# Patient Record
Sex: Male | Born: 1980 | Race: Black or African American | Hispanic: No | Marital: Single | Smoking: Former smoker
Health system: Southern US, Community
[De-identification: ages and names within clinical notes are randomized; demographics above are authoritative.]

## PROBLEM LIST (undated history)

## (undated) DIAGNOSIS — M545 Low back pain, unspecified: Secondary | ICD-10-CM

## (undated) HISTORY — DX: Low back pain: M54.5

## (undated) HISTORY — DX: Low back pain, unspecified: M54.50

---

## 2003-09-01 ENCOUNTER — Encounter: Admission: RE | Admit: 2003-09-01 | Discharge: 2003-09-01 | Payer: Self-pay | Admitting: Internal Medicine

## 2004-04-06 ENCOUNTER — Emergency Department (HOSPITAL_COMMUNITY): Admission: EM | Admit: 2004-04-06 | Discharge: 2004-04-06 | Payer: Self-pay | Admitting: Family Medicine

## 2004-04-10 ENCOUNTER — Ambulatory Visit: Payer: Self-pay | Admitting: Family Medicine

## 2004-04-16 ENCOUNTER — Ambulatory Visit: Payer: Self-pay | Admitting: Family Medicine

## 2013-11-30 ENCOUNTER — Encounter: Payer: Self-pay | Admitting: Podiatry

## 2013-11-30 ENCOUNTER — Ambulatory Visit (INDEPENDENT_AMBULATORY_CARE_PROVIDER_SITE_OTHER): Payer: Federal, State, Local not specified - PPO

## 2013-11-30 ENCOUNTER — Ambulatory Visit (INDEPENDENT_AMBULATORY_CARE_PROVIDER_SITE_OTHER): Payer: Federal, State, Local not specified - PPO | Admitting: Podiatry

## 2013-11-30 VITALS — BP 117/75 | HR 92 | Resp 16 | Ht 76.0 in | Wt 286.0 lb

## 2013-11-30 DIAGNOSIS — M722 Plantar fascial fibromatosis: Secondary | ICD-10-CM | POA: Insufficient documentation

## 2013-11-30 MED ORDER — MELOXICAM 7.5 MG PO TABS
7.5000 mg | ORAL_TABLET | Freq: Every day | ORAL | Status: DC
Start: 2013-11-30 — End: 2014-06-30

## 2013-11-30 NOTE — Patient Instructions (Signed)
Plantar Fasciitis (Heel Spur Syndrome) with Rehab The plantar fascia is a fibrous, ligament-like, soft-tissue structure that spans the bottom of the foot. Plantar fasciitis is a condition that causes pain in the foot due to inflammation of the tissue. SYMPTOMS   Pain and tenderness on the underneath side of the foot.  Pain that worsens with standing or walking. CAUSES  Plantar fasciitis is caused by irritation and injury to the plantar fascia on the underneath side of the foot. Common mechanisms of injury include:  Direct trauma to bottom of the foot.  Damage to a small nerve that runs under the foot where the main fascia attaches to the heel bone.  Stress placed on the plantar fascia due to bone spurs. RISK INCREASES WITH:   Activities that place stress on the plantar fascia (running, jumping, pivoting, or cutting).  Poor strength and flexibility.  Improperly fitted shoes.  Tight calf muscles.  Flat feet.  Failure to warm-up properly before activity.  Obesity. PREVENTION  Warm up and stretch properly before activity.  Allow for adequate recovery between workouts.  Maintain physical fitness:  Strength, flexibility, and endurance.  Cardiovascular fitness.  Maintain a health body weight.  Avoid stress on the plantar fascia.  Wear properly fitted shoes, including arch supports for individuals who have flat feet. PROGNOSIS  If treated properly, then the symptoms of plantar fasciitis usually resolve without surgery. However, occasionally surgery is necessary. RELATED COMPLICATIONS   Recurrent symptoms that may result in a chronic condition.  Problems of the lower back that are caused by compensating for the injury, such as limping.  Pain or weakness of the foot during push-off following surgery.  Chronic inflammation, scarring, and partial or complete fascia tear, occurring more often from repeated injections. TREATMENT  Treatment initially involves the use of  ice and medication to help reduce pain and inflammation. The use of strengthening and stretching exercises may help reduce pain with activity, especially stretches of the Achilles tendon. These exercises may be performed at home or with a therapist. Your caregiver may recommend that you use heel cups of arch supports to help reduce stress on the plantar fascia. Occasionally, corticosteroid injections are given to reduce inflammation. If symptoms persist for greater than 6 months despite non-surgical (conservative), then surgery may be recommended.  MEDICATION   If pain medication is necessary, then nonsteroidal anti-inflammatory medications, such as aspirin and ibuprofen, or other minor pain relievers, such as acetaminophen, are often recommended.  Do not take pain medication within 7 days before surgery.  Prescription pain relievers may be given if deemed necessary by your caregiver. Use only as directed and only as much as you need.  Corticosteroid injections may be given by your caregiver. These injections should be reserved for the most serious cases, because they may only be given a certain number of times. HEAT AND COLD  Cold treatment (icing) relieves pain and reduces inflammation. Cold treatment should be applied for 10 to 15 minutes every 2 to 3 hours for inflammation and pain and immediately after any activity that aggravates your symptoms. Use ice packs or massage the area with a piece of ice (ice massage).  Heat treatment may be used prior to performing the stretching and strengthening activities prescribed by your caregiver, physical therapist, or athletic trainer. Use a heat pack or soak the injury in warm water. SEEK IMMEDIATE MEDICAL CARE IF:  Treatment seems to offer no benefit, or the condition worsens.  Any medications produce adverse side effects. EXERCISES RANGE   OF MOTION (ROM) AND STRETCHING EXERCISES - Plantar Fasciitis (Heel Spur Syndrome) These exercises may help you  when beginning to rehabilitate your injury. Your symptoms may resolve with or without further involvement from your physician, physical therapist or athletic trainer. While completing these exercises, remember:   Restoring tissue flexibility helps normal motion to return to the joints. This allows healthier, less painful movement and activity.  An effective stretch should be held for at least 30 seconds.  A stretch should never be painful. You should only feel a gentle lengthening or release in the stretched tissue. RANGE OF MOTION - Toe Extension, Flexion  Sit with your right / left leg crossed over your opposite knee.  Grasp your toes and gently pull them back toward the top of your foot. You should feel a stretch on the bottom of your toes and/or foot.  Hold this stretch for __________ seconds.  Now, gently pull your toes toward the bottom of your foot. You should feel a stretch on the top of your toes and or foot.  Hold this stretch for __________ seconds. Repeat __________ times. Complete this stretch __________ times per day.  RANGE OF MOTION - Ankle Dorsiflexion, Active Assisted  Remove shoes and sit on a chair that is preferably not on a carpeted surface.  Place right / left foot under knee. Extend your opposite leg for support.  Keeping your heel down, slide your right / left foot back toward the chair until you feel a stretch at your ankle or calf. If you do not feel a stretch, slide your bottom forward to the edge of the chair, while still keeping your heel down.  Hold this stretch for __________ seconds. Repeat __________ times. Complete this stretch __________ times per day.  STRETCH - Gastroc, Standing  Place hands on wall.  Extend right / left leg, keeping the front knee somewhat bent.  Slightly point your toes inward on your back foot.  Keeping your right / left heel on the floor and your knee straight, shift your weight toward the wall, not allowing your back to  arch.  You should feel a gentle stretch in the right / left calf. Hold this position for __________ seconds. Repeat __________ times. Complete this stretch __________ times per day. STRETCH - Soleus, Standing  Place hands on wall.  Extend right / left leg, keeping the other knee somewhat bent.  Slightly point your toes inward on your back foot.  Keep your right / left heel on the floor, bend your back knee, and slightly shift your weight over the back leg so that you feel a gentle stretch deep in your back calf.  Hold this position for __________ seconds. Repeat __________ times. Complete this stretch __________ times per day. STRETCH - Gastrocsoleus, Standing  Note: This exercise can place a lot of stress on your foot and ankle. Please complete this exercise only if specifically instructed by your caregiver.   Place the ball of your right / left foot on a step, keeping your other foot firmly on the same step.  Hold on to the wall or a rail for balance.  Slowly lift your other foot, allowing your body weight to press your heel down over the edge of the step.  You should feel a stretch in your right / left calf.  Hold this position for __________ seconds.  Repeat this exercise with a slight bend in your right / left knee. Repeat __________ times. Complete this stretch __________ times per day.    STRENGTHENING EXERCISES - Plantar Fasciitis (Heel Spur Syndrome)  These exercises may help you when beginning to rehabilitate your injury. They may resolve your symptoms with or without further involvement from your physician, physical therapist or athletic trainer. While completing these exercises, remember:   Muscles can gain both the endurance and the strength needed for everyday activities through controlled exercises.  Complete these exercises as instructed by your physician, physical therapist or athletic trainer. Progress the resistance and repetitions only as guided. STRENGTH -  Towel Curls  Sit in a chair positioned on a non-carpeted surface.  Place your foot on a towel, keeping your heel on the floor.  Pull the towel toward your heel by only curling your toes. Keep your heel on the floor.  If instructed by your physician, physical therapist or athletic trainer, add ____________________ at the end of the towel. Repeat __________ times. Complete this exercise __________ times per day. STRENGTH - Ankle Inversion  Secure one end of a rubber exercise band/tubing to a fixed object (table, pole). Loop the other end around your foot just before your toes.  Place your fists between your knees. This will focus your strengthening at your ankle.  Slowly, pull your big toe up and in, making sure the band/tubing is positioned to resist the entire motion.  Hold this position for __________ seconds.  Have your muscles resist the band/tubing as it slowly pulls your foot back to the starting position. Repeat __________ times. Complete this exercises __________ times per day.  Document Released: 12/31/2004 Document Revised: 03/25/2011 Document Reviewed: 04/14/2008 [employer] Patient Information 2015 [employer]. This information is not intended to replace advice given to you by your health care provider. Make sure you discuss any questions you have with your health care provider.  

## 2013-11-30 NOTE — Progress Notes (Signed)
   Subjective:    Patient ID: Eric Faulkner, male    DOB: 1981-01-14, 33 y.o.   MRN: [id number]  HPI Comments: 33 year old male presents the office today with complaints of pain to both of his heels on the bottom aspect of his been ongoing for approximately 2 months. He believes he has plantar fasciitis. He states the pain has been progressive. He states that he has pain particularly in the morning or after periods of rest. After getting up after rest with ambulation he has improvement in symptoms. He states that he had similar pain last year as she certainly when were closed toed shoes and the pain resolved. He states the pain is started [facility] cooler and he has been wearing were closed toe shoes. He has been massaging his feet from a place that the shoe market. He denies any recent injury or trauma to the area. No other complaints at this time.   Foot Pain      Review of Systems  Musculoskeletal: Positive for back pain.  All other systems reviewed and are negative.      Objective:   Physical Exam AAO x3, NAD DP/PT pulses palpable bilaterally, CRT less than 3 seconds Protective sensation intact with Simms Weinstein monofilament, vibratory sensation intact, Achilles tendon reflex intact Dennis palpation of the plantar medial tubercle of the calcaneus at the insertion of the plantar fascia bilaterally. There is no pain within the arch of the foot and the plantar fascia. To be intact. There is no pain with lateral compression of the calcaneus or pain with vibratory sensation. No pain along the posterior aspect of the calcaneus or along the course of the insertion of the Achilles tendon. There is no overlying edema, erythema, increase in warmth. MMT 5/5, ROM WNL Calf pain with compression, swelling, warmth, erythema No open lesions or pre-ulcerative lesions.       Assessment & Plan:  33 year old male with bilateral heel pain, likely plantar fasciitis. -X-rays  were obtained and reviewed the patient. -Conservative versus surgical treatment discussed including alternatives, risks, complications. -Patient to hold off on any steroid injection at this time. -Discussed stretching exercises. -Ice to the affected area. -Prescribed mobic -Discussed shoe gear modifications and possible inserts. -Follow-up in 3 weeks or sooner any problems are to arise. In the meantime, call the office with any questions, concerns, change in symptoms.

## 2013-12-03 ENCOUNTER — Ambulatory Visit: Payer: Self-pay | Admitting: Podiatry

## 2013-12-21 ENCOUNTER — Ambulatory Visit: Payer: Federal, State, Local not specified - PPO | Admitting: Podiatry

## 2014-06-30 ENCOUNTER — Encounter: Payer: Self-pay | Admitting: Family Medicine

## 2014-06-30 ENCOUNTER — Encounter (INDEPENDENT_AMBULATORY_CARE_PROVIDER_SITE_OTHER): Payer: Self-pay

## 2014-06-30 ENCOUNTER — Ambulatory Visit (INDEPENDENT_AMBULATORY_CARE_PROVIDER_SITE_OTHER): Payer: Federal, State, Local not specified - PPO | Admitting: Family Medicine

## 2014-06-30 VITALS — BP 108/70 | HR 90 | Temp 97.5°F | Ht 76.0 in | Wt 300.0 lb

## 2014-06-30 DIAGNOSIS — Z0000 Encounter for general adult medical examination without abnormal findings: Secondary | ICD-10-CM | POA: Diagnosis not present

## 2014-06-30 DIAGNOSIS — J302 Other seasonal allergic rhinitis: Secondary | ICD-10-CM | POA: Diagnosis not present

## 2014-06-30 DIAGNOSIS — R202 Paresthesia of skin: Secondary | ICD-10-CM | POA: Insufficient documentation

## 2014-06-30 DIAGNOSIS — Z13220 Encounter for screening for lipoid disorders: Secondary | ICD-10-CM

## 2014-06-30 DIAGNOSIS — L739 Follicular disorder, unspecified: Secondary | ICD-10-CM

## 2014-06-30 NOTE — Progress Notes (Signed)
Pre visit review using our clinic review tool, if applicable. No additional management support is needed unless otherwise documented below in the visit note. 

## 2014-06-30 NOTE — Patient Instructions (Addendum)
Start regular exercise and work on healthy eating habits. Increase water in diet, decrease juice or soda. Decrease eating out. Return fasting in the next few weeks for labs. Look into Tdap.  Make appt to be seen if foot issues not resolving.

## 2014-06-30 NOTE — Assessment & Plan Note (Signed)
Eval with labs. 

## 2014-06-30 NOTE — Assessment & Plan Note (Signed)
Healing. Use topical antibiotic gel to treat in future.Start using antibacterial soap to prevent.

## 2014-06-30 NOTE — Progress Notes (Signed)
Subjective:    Patient ID: Eric Faulkner, male    DOB: 1980/04/06, 34 y.o.   MRN: [id number]  HPI   34 year old male presents to establish care. He has been seen by Dr. Daisy Lazar in past.  Last CPX was 2-3 years ago. Has not had chol and DM screen.  Seasonal allergies stable control with Claritin prn. Worse in spring cutting grass.  He is followed by Dr. Ardelle Anton for podiatry to treat his plantar fasciitis. Last OV 11/2013. Bothersome to him but better overall.  He has gained 20 lbs in last year.  Body mass index is 36.53 kg/(m^2).  No regular exercise other than mowing lawn. Diet: Eats out of a lot.  Some fast food.  Social History /Family History/Past Medical History reviewed and updated if needed.   Review of Systems  Constitutional: Negative for fever.  HENT: Negative for ear pain.   Eyes: Negative for pain.  Respiratory: Negative for cough and shortness of breath.   Gastrointestinal: Negative for abdominal pain, diarrhea, constipation and blood in stool.  Genitourinary: Negative for dysuria, hematuria, decreased urine volume and penile swelling.       Occ notes ? Ingrown hairs at pubic hair  no new exposure to STD  Musculoskeletal: Positive for back pain. Negative for myalgias, arthralgias and gait problem.  Skin: Positive for rash.       In last 1 week noted dried lesion on left side at waist band, notes off and on.  Neurological: Negative for dizziness and numbness.       Tingling on bottom of feet in last year. Heel pain with walking   Psychiatric/Behavioral: Negative for behavioral problems, self-injury, dysphoric mood and agitation. The patient is not nervous/anxious.        Objective:   Physical Exam  Constitutional: He appears well-developed and well-nourished.  Non-toxic appearance. He does not appear ill. No distress.  HENT:  Head: Normocephalic and atraumatic.  Right Ear: Hearing, tympanic membrane, external ear and ear canal normal.  Left Ear: Hearing,  tympanic membrane, external ear and ear canal normal.  Nose: Nose normal.  Mouth/Throat: Uvula is midline, oropharynx is clear and moist and mucous membranes are normal.  Eyes: Conjunctivae, EOM and lids are normal. Pupils are equal, round, and reactive to light. Lids are everted and swept, no foreign bodies found.  Neck: Trachea normal, normal range of motion and phonation normal. Neck supple. Carotid bruit is not present. No thyroid mass and no thyromegaly present.  Cardiovascular: Normal rate, regular rhythm, S1 normal, S2 normal, intact distal pulses and normal pulses.  Exam reveals no gallop.   No murmur heard. Pulmonary/Chest: Breath sounds normal. He has no wheezes. He has no rhonchi. He has no rales.  Abdominal: Soft. Normal appearance and bowel sounds are normal. There is no hepatosplenomegaly. There is no tenderness. There is no rebound, no guarding and no CVA tenderness. No hernia. Hernia confirmed negative in the right inguinal area and confirmed negative in the left inguinal area.  Genitourinary: Prostate normal, testes normal and penis normal. Rectal exam shows no external hemorrhoid, no internal hemorrhoid, no fissure, no mass, no tenderness and anal tone normal. Guaiac negative stool.    Prostate is not enlarged and not tender. Right testis shows no mass and no tenderness. Left testis shows no mass and no tenderness. No paraphimosis or penile tenderness.  Few small ingrown hairs, pustules in groin and in pubic hair, most healing, scabbing on left side at wiast band.  Lymphadenopathy:    He has no cervical adenopathy.       Right: No inguinal adenopathy present.       Left: No inguinal adenopathy present.  Neurological: He is alert. He has normal strength and normal reflexes. No cranial nerve deficit or sensory deficit. Gait normal.  Skin: Skin is warm, dry and intact. No rash noted.  Psychiatric: He has a normal mood and affect. His speech is normal and behavior is normal.  Judgment normal.          Assessment & Plan:  The patient's preventative maintenance and recommended screening tests for an annual wellness exam were reviewed in full today. Brought up to date unless services declined.  Counselled on the importance of diet, exercise, and its role in overall health and mortality. The patient's FH and SH was reviewed, including their home life, tobacco status, and drug and alcohol status.   Vaccines: Look into Tdap. No early prostate cancer. Refused STD testing.

## 2014-07-01 ENCOUNTER — Encounter: Payer: Self-pay | Admitting: *Deleted

## 2014-07-01 ENCOUNTER — Other Ambulatory Visit (INDEPENDENT_AMBULATORY_CARE_PROVIDER_SITE_OTHER): Payer: Federal, State, Local not specified - PPO

## 2014-07-01 DIAGNOSIS — R202 Paresthesia of skin: Secondary | ICD-10-CM | POA: Diagnosis not present

## 2014-07-01 DIAGNOSIS — Z13220 Encounter for screening for lipoid disorders: Secondary | ICD-10-CM | POA: Diagnosis not present

## 2014-07-01 LAB — COMPREHENSIVE METABOLIC PANEL
ALBUMIN: 4.1 g/dL (ref 3.5–5.2)
ALK PHOS: 68 U/L (ref 39–117)
ALT: 23 U/L (ref 0–53)
AST: 15 U/L (ref 0–37)
BILIRUBIN TOTAL: 0.5 mg/dL (ref 0.2–1.2)
BUN: 15 mg/dL (ref 6–23)
CO2: 27 mEq/L (ref 19–32)
Calcium: 9.5 mg/dL (ref 8.4–10.5)
Chloride: 105 mEq/L (ref 96–112)
Creatinine, Ser: 1.25 mg/dL (ref 0.40–1.50)
GFR: 84.25 mL/min (ref >60.00)
GLUCOSE: 99 mg/dL (ref 70–99)
POTASSIUM: 3.9 meq/L (ref 3.5–5.1)
SODIUM: 137 meq/L (ref 135–145)
TOTAL PROTEIN: 7.4 g/dL (ref 6.0–8.3)

## 2014-07-01 LAB — LIPID PANEL
CHOLESTEROL: 187 mg/dL (ref 0–200)
HDL: 39.1 mg/dL (ref >39.00)
LDL Cholesterol: 129 mg/dL — ABNORMAL HIGH (ref 0–99)
NONHDL: 147.9
Total CHOL/HDL Ratio: 5
Triglycerides: 94 mg/dL (ref 0.0–149.0)
VLDL: 18.8 mg/dL (ref 0.0–40.0)

## 2014-07-01 LAB — TSH: TSH: 1.14 u[IU]/mL (ref 0.35–4.50)

## 2014-07-01 LAB — VITAMIN B12: VITAMIN B 12: 280 pg/mL (ref 211–911)

## 2015-05-16 ENCOUNTER — Ambulatory Visit: Payer: Federal, State, Local not specified - PPO | Admitting: Family Medicine

## 2015-05-17 ENCOUNTER — Encounter: Payer: Self-pay | Admitting: Family Medicine

## 2015-05-17 ENCOUNTER — Ambulatory Visit (INDEPENDENT_AMBULATORY_CARE_PROVIDER_SITE_OTHER): Payer: Federal, State, Local not specified - PPO | Admitting: Family Medicine

## 2015-05-17 VITALS — BP 110/62 | HR 87 | Ht 76.0 in | Wt 287.8 lb

## 2015-05-17 DIAGNOSIS — F4329 Adjustment disorder with other symptoms: Secondary | ICD-10-CM

## 2015-05-17 NOTE — Assessment & Plan Note (Signed)
Pt with difficulty handling tasks at work due to feeling overwhelmed. He is not functioning at work in  Stress of job has spread to home life. Feelings of mild depression and anhedonia. NO SI, NO HI. Pt has noted some initial improvement with counseling (will continue) and church support. He has been reading self help literature and trying to regain balance in life. He requests 30 days to continue working on stress reduction and stress management techniques. FMLA forms completed for leave.  Pt with history of mental breakdown causing hospitalization/medication. It is in his best interest to manage stress before he gets to a " breakdown" point.

## 2015-05-17 NOTE — Progress Notes (Signed)
   Subjective:    Patient ID: Eric Faulkner, male    DOB: [birth year], 35 y.o.   MRN: [id number]  HPI  35 year old male previously in good health presents to clinic today for new onset stress at work in last several months.  He has been very frustrated with issues at work.  He has been overwhelmed.  Pt vaguely mentions discrimination issues at work that are contributing to stress.  Some anhedonia. More irritable, moody. He was sleeping more often. Family noticed him being distance, more tearful. Wife said he was not interacting. Has been isolating himself more. No mania symptoms.  No SI, no HI.  He has been praying more, going to revival. Things are improving. Has church counseling and has set up counselor. Has appt next week.  He has now had a change at work that will lessen stress at work.  He is not interested in medication to treat.  Last day at work 05/09/2014-06/08/2014.   Hx of breakdown in past. [facility] with medication. Pt is vague about his diagnosis. Social History /Family History/Past Medical History reviewed and updated if needed.  Review of Systems  Constitutional: Negative for fever and fatigue.  HENT: Negative for ear pain.   Eyes: Negative for pain.  Respiratory: Negative for shortness of breath.   Cardiovascular: Negative for chest pain.       Objective:   Physical Exam  Constitutional: Vital signs are normal. He appears well-developed and well-nourished.  HENT:  Head: Normocephalic.  Right Ear: Hearing normal.  Left Ear: Hearing normal.  Nose: Nose normal.  Mouth/Throat: Oropharynx is clear and moist and mucous membranes are normal.  Neck: Trachea normal. Carotid bruit is not present. No thyroid mass and no thyromegaly present.  Cardiovascular: Normal rate, regular rhythm and normal pulses.  Exam reveals no gallop, no distant heart sounds and no friction rub.   No murmur heard. No peripheral edema    Pulmonary/Chest: Effort normal and breath sounds normal. No respiratory distress.  Skin: Skin is warm, dry and intact. No rash noted.  Psychiatric: He has a normal mood and affect. His speech is normal. Judgment and thought content normal. He is not agitated. Cognition and memory are normal.          Assessment & Plan:

## 2015-07-04 ENCOUNTER — Telehealth: Payer: Self-pay | Admitting: Family Medicine

## 2015-07-04 ENCOUNTER — Other Ambulatory Visit (INDEPENDENT_AMBULATORY_CARE_PROVIDER_SITE_OTHER): Payer: Federal, State, Local not specified - PPO

## 2015-07-04 DIAGNOSIS — Z13220 Encounter for screening for lipoid disorders: Secondary | ICD-10-CM

## 2015-07-04 LAB — LIPID PANEL
CHOLESTEROL: 183 mg/dL (ref 0–200)
HDL: 42.2 mg/dL (ref >39.00)
LDL CALC: 122 mg/dL — AB (ref 0–99)
NonHDL: 140.37
TRIGLYCERIDES: 94 mg/dL (ref 0.0–149.0)
Total CHOL/HDL Ratio: 4
VLDL: 18.8 mg/dL (ref 0.0–40.0)

## 2015-07-04 LAB — COMPREHENSIVE METABOLIC PANEL
ALBUMIN: 4.4 g/dL (ref 3.5–5.2)
ALT: 19 U/L (ref 0–53)
AST: 17 U/L (ref 0–37)
Alkaline Phosphatase: 70 U/L (ref 39–117)
BILIRUBIN TOTAL: 0.5 mg/dL (ref 0.2–1.2)
BUN: 17 mg/dL (ref 6–23)
CALCIUM: 9.9 mg/dL (ref 8.4–10.5)
CHLORIDE: 105 meq/L (ref 96–112)
CO2: 29 mEq/L (ref 19–32)
CREATININE: 1.31 mg/dL (ref 0.40–1.50)
GFR: 79.36 mL/min (ref >60.00)
Glucose, Bld: 94 mg/dL (ref 70–99)
Potassium: 4 mEq/L (ref 3.5–5.1)
Sodium: 140 mEq/L (ref 135–145)
Total Protein: 8 g/dL (ref 6.0–8.3)

## 2015-07-04 NOTE — Telephone Encounter (Signed)
-----   Message from Alvina Chouerri J Walsh sent at 06/23/2015  4:42 PM EDT ----- Regarding: Lab orders for Tuesday, 6.20.17 Patient is scheduled for CPX labs, please order future labs, Thanks , Camelia Engerri

## 2015-07-11 ENCOUNTER — Ambulatory Visit (INDEPENDENT_AMBULATORY_CARE_PROVIDER_SITE_OTHER): Payer: Federal, State, Local not specified - PPO | Admitting: Family Medicine

## 2015-07-11 ENCOUNTER — Encounter: Payer: Self-pay | Admitting: Family Medicine

## 2015-07-11 VITALS — BP 108/74 | HR 82 | Temp 98.4°F | Ht 75.5 in | Wt 291.5 lb

## 2015-07-11 DIAGNOSIS — Z0000 Encounter for general adult medical examination without abnormal findings: Secondary | ICD-10-CM

## 2015-07-11 DIAGNOSIS — Z23 Encounter for immunization: Secondary | ICD-10-CM

## 2015-07-11 DIAGNOSIS — F4329 Adjustment disorder with other symptoms: Secondary | ICD-10-CM | POA: Diagnosis not present

## 2015-07-11 NOTE — Patient Instructions (Addendum)
Work on healthy eating and weight loss. Keep up great exercise.

## 2015-07-11 NOTE — Progress Notes (Signed)
   Subjective:    Patient ID: Eric Faulkner, male    DOB: [birth year], 35 y.o.   MRN: [id number]  HPI  The patient is here for annual wellness exam and preventative care.    He is doing better with stress level.. Still issues with work. He plans on leaving in 12 months.  BP Readings from Last 3 Encounters:  07/11/15 108/74  05/17/15 110/62  06/30/14 108/70    Wt Readings from Last 3 Encounters:  07/11/15 291 lb 8 oz (132.224 kg)  05/17/15 287 lb 12.8 oz (130.545 kg)  06/30/14 300 lb (136.079 kg)   Exercise: walking 2 miles daily, swimming lessons. Diet:still a lot of take out.  Body mass index is 35.94 kg/(m^2).  Seasonal allergies stable control with Claritin prn.   Social History /Family History/Past Medical History reviewed and updated if needed.   Review of Systems  Constitutional: Negative for fever and fatigue.  HENT: Negative for ear pain.   Eyes: Negative for pain.  Respiratory: Negative for cough and shortness of breath.   Cardiovascular: Negative for chest pain, palpitations and leg swelling.  Gastrointestinal: Negative for abdominal pain.  Genitourinary: Negative for dysuria.  Musculoskeletal: Negative for arthralgias.  Neurological: Negative for syncope, light-headedness and headaches.  Psychiatric/Behavioral: Negative for dysphoric mood.       Objective:   Physical Exam  Constitutional: Vital signs are normal. He appears well-developed and well-nourished.  Non-toxic appearance. He does not appear ill. No distress.  HENT:  Head: Normocephalic and atraumatic.  Right Ear: Hearing, tympanic membrane, external ear and ear canal normal.  Left Ear: Hearing, tympanic membrane, external ear and ear canal normal.  Nose: Nose normal.  Mouth/Throat: Uvula is midline, oropharynx is clear and moist and mucous membranes are normal.  Eyes: Conjunctivae, EOM and lids are normal. Pupils are equal, round, and reactive to light. Lids are everted and swept, no foreign  bodies found.  Neck: Trachea normal, normal range of motion and phonation normal. Neck supple. Carotid bruit is not present. No thyroid mass and no thyromegaly present.  Cardiovascular: Normal rate, regular rhythm, S1 normal, S2 normal, intact distal pulses and normal pulses.  Exam reveals no gallop, no distant heart sounds and no friction rub.   No murmur heard. No peripheral edema  Pulmonary/Chest: Effort normal and breath sounds normal. No respiratory distress. He has no wheezes. He has no rhonchi. He has no rales.  Abdominal: Soft. Normal appearance and bowel sounds are normal. There is no hepatosplenomegaly. There is no tenderness. There is no rebound, no guarding and no CVA tenderness. No hernia.  Lymphadenopathy:    He has no cervical adenopathy.  Neurological: He is alert. He has normal strength and normal reflexes. No cranial nerve deficit or sensory deficit. Gait normal.  Skin: Skin is warm, dry and intact. No rash noted.  Psychiatric: He has a normal mood and affect. His speech is normal and behavior is normal. Judgment and thought content normal.          Assessment & Plan:  The patient's preventative maintenance and recommended screening tests for an annual wellness exam were reviewed in full today. Brought up to date unless services declined.  Counselled on the importance of diet, exercise, and its role in overall health and mortality. The patient's FH and SH was reviewed, including their home life, tobacco status, and drug and alcohol status.   Vaccines: Given tdap today. No early prostate cancer, colon cancer.  No STD.  Non smoker:

## 2015-07-11 NOTE — Progress Notes (Signed)
Pre visit review using our clinic review tool, if applicable. No additional management support is needed unless otherwise documented below in the visit note. 

## 2015-07-11 NOTE — Assessment & Plan Note (Signed)
Improved with stress reduction.

## 2016-01-19 DIAGNOSIS — F411 Generalized anxiety disorder: Secondary | ICD-10-CM | POA: Diagnosis not present

## 2016-02-05 ENCOUNTER — Encounter: Payer: Self-pay | Admitting: Family Medicine

## 2016-02-05 ENCOUNTER — Ambulatory Visit (INDEPENDENT_AMBULATORY_CARE_PROVIDER_SITE_OTHER): Payer: Federal, State, Local not specified - PPO | Admitting: Family Medicine

## 2016-02-05 VITALS — BP 100/60 | HR 91 | Temp 98.5°F | Ht 75.5 in | Wt 289.5 lb

## 2016-02-05 DIAGNOSIS — M5416 Radiculopathy, lumbar region: Secondary | ICD-10-CM

## 2016-02-05 MED ORDER — PREDNISONE 20 MG PO TABS: ORAL_TABLET | ORAL | 0 refills | Status: DC

## 2016-02-05 MED ORDER — HYDROCODONE-ACETAMINOPHEN 5-325 MG PO TABS: 1.0000 | ORAL_TABLET | Freq: Four times a day (QID) | ORAL | 0 refills | Status: DC | PRN

## 2016-02-05 NOTE — Progress Notes (Signed)
Dr. Karleen Hampshire T. Tavyn Kurka, MD, CAQ Sports Medicine Primary Care and Sports Medicine [address] [place], [address] Phone: [phone] Fax: [phone]  02/05/2016  Patient: Eric Faulkner, MRN: [id number], DOB: [birth year], 36 y.o.  Primary Physician:  Kerby Nora, MD   Chief Complaint  Patient presents with  . Back Pain    Radiating down left leg   Subjective:   Eric Faulkner is a 36 y.o. very pleasant male patient who presents with the following: Back Pain  ongoing for approximately: 2 weeks The patient has had back pain before. The back pain is localized into the lumbar spine area. They also describe radiculopathy on the L.  Low back pain with radiculopathy - numbness thigh, lateral 3 toes.  Off and on for many years. Happened before - this time, about 2 weeks.   Military 1998 - 2001.  Taking some tylenol now.    No bowel or bladder incontinence. No focal weakness. Prior interventions: none Physical therapy: No Chiropractic manipulations: No Acupuncture: No Osteopathic manipulation: No Heat or cold: Minimal effect  Past Medical History, Surgical History, Family History, Medications, Allergies have been reviewed and updated if relevant.  Patient Active Problem List   Diagnosis Date Noted  . Adjustment disorder with work problems 05/17/2015  . Seasonal allergies 06/30/2014    Past Medical History:  Diagnosis Date  . Low back pain     No past surgical history on file.  Social History   Social History  . Marital status: Married    Spouse name: N/A  . Number of children: N/A  . Years of education: N/A   Occupational History  . Not on file.   Social History Main Topics  . Smoking status: Former Smoker    Types: Cigars  . Smokeless tobacco: Never Used  . Alcohol use No  . Drug use: No  . Sexual activity: Yes   Other Topics Concern  . Not on file   Social History Narrative   Works in Research officer, political party.   Married, 3 kids  ( 18 months,  43yrs and 11 yrs)          Family History  Problem Relation Age of Onset  . Cancer Mother 17    breast  . Cancer Maternal Grandfather     throat  . ALS Paternal Uncle     No Known Allergies  Medication list reviewed and updated in full in [facility].  GEN: No fevers, chills. Nontoxic. Primarily MSK c/o today. MSK: Detailed in the HPI GI: tolerating PO intake without difficulty Neuro: As above  Otherwise the pertinent positives of the ROS are noted above.    Objective:   Blood pressure 100/60, pulse 91, temperature 98.5 F (36.9 C), temperature source Oral, height 6' 3.5" (1.918 m), weight 289 lb 8 oz (131.3 kg).  Gen: Well-developed,well-nourished,in no acute distress; alert,appropriate and cooperative throughout examination HEENT: Normocephalic and atraumatic without obvious abnormalities.  Ears, externally no deformities Pulm: Breathing comfortably in no respiratory distress Range of motion at  the waist: Flexion, rotation and lateral bending: limited flexion, all other wnl  No echymosis or edema Rises to examination table with no difficulty Gait: minimally antalgic  Inspection/Deformity: No abnormality Paraspinus T:  Mild ttp l3-s1  B Ankle Dorsiflexion (L5,4): 5/5 B Great Toe Dorsiflexion (L5,4): 5/5 Heel Walk (L5): WNL Toe Walk (S1): WNL Rise/Squat (L4): WNL, mild pain  SENSORY B Medial Foot (L4): WNL B Dorsum (L5): WNL B Lateral (S1): decreased  Posterior thigh decreased Light Touch: as above Pinprick: as above  REFLEXES Knee (L4): 2+ Ankle (S1): 2+  B SLR, seated: neg B SLR, supine: + B FABER: neg B Reverse FABER: neg B Greater Troch: NT B Log Roll: neg B Stork: NT B Sciatic Notch: NT  Radiology: No results found.  Assessment and Plan:   Acute left lumbar radiculopathy  Anatomy reviewed. Conservative algorithms for acute back pain generally begin with the following: NSAIDS, Muscle Relaxants, Mild pain medication  Probable nerve  encroachment on the L. Some numbness, no weakness.  Start with medications, core rehab, and progress from there following low back pain algorithm. No red flags are present.  Follow-up: No Follow-up on file.  Meds ordered this encounter  Medications  . predniSONE (DELTASONE) 20 MG tablet    Sig: 2 tabs po daily for 5 days, then 1 po daily for 5 days    Dispense:  15 tablet    Refill:  0  . HYDROcodone-acetaminophen (NORCO/VICODIN) 5-325 MG tablet    Sig: Take 1 tablet by mouth every 6 (six) hours as needed for moderate pain.    Dispense:  20 tablet    Refill:  0   Signed,  Sayaka Hoeppner T. Doneta Bayman, MD   Allergies as of 02/05/2016   No Known Allergies     Medication List       Accurate as of 02/05/16  2:10 PM. Always use your most recent med list.          HYDROcodone-acetaminophen 5-325 MG tablet Commonly known as:  NORCO/VICODIN Take 1 tablet by mouth every 6 (six) hours as needed for moderate pain.   predniSONE 20 MG tablet Commonly known as:  DELTASONE 2 tabs po daily for 5 days, then 1 po daily for 5 days

## 2016-02-05 NOTE — Progress Notes (Signed)
Pre visit review using our clinic review tool, if applicable. No additional management support is needed unless otherwise documented below in the visit note. 

## 2016-02-19 DIAGNOSIS — F411 Generalized anxiety disorder: Secondary | ICD-10-CM | POA: Diagnosis not present

## 2016-04-16 DIAGNOSIS — F411 Generalized anxiety disorder: Secondary | ICD-10-CM | POA: Diagnosis not present

## 2016-04-29 DIAGNOSIS — F411 Generalized anxiety disorder: Secondary | ICD-10-CM | POA: Diagnosis not present

## 2016-07-07 ENCOUNTER — Telehealth: Payer: Self-pay | Admitting: Family Medicine

## 2016-07-07 DIAGNOSIS — Z13220 Encounter for screening for lipoid disorders: Secondary | ICD-10-CM

## 2016-07-07 NOTE — Telephone Encounter (Signed)
-----   Message from Baldomero LamyNatasha C Chavers sent at 06/28/2016  2:13 PM EDT ----- Regarding: Cpx f/u labs Mon 6/25, need orders. Thanks! Please order  future cpx labs for pt's upcoming lab appt. Thanks Rodney Boozeasha

## 2016-07-08 ENCOUNTER — Other Ambulatory Visit (INDEPENDENT_AMBULATORY_CARE_PROVIDER_SITE_OTHER): Payer: Federal, State, Local not specified - PPO

## 2016-07-08 DIAGNOSIS — Z13220 Encounter for screening for lipoid disorders: Secondary | ICD-10-CM

## 2016-07-08 LAB — COMPREHENSIVE METABOLIC PANEL
ALBUMIN: 4.2 g/dL (ref 3.5–5.2)
ALK PHOS: 65 U/L (ref 39–117)
ALT: 20 U/L (ref 0–53)
AST: 13 U/L (ref 0–37)
BILIRUBIN TOTAL: 0.5 mg/dL (ref 0.2–1.2)
BUN: 16 mg/dL (ref 6–23)
CALCIUM: 9.5 mg/dL (ref 8.4–10.5)
CO2: 26 mEq/L (ref 19–32)
Chloride: 107 mEq/L (ref 96–112)
Creatinine, Ser: 1.27 mg/dL (ref 0.40–1.50)
GFR: 81.8 mL/min (ref >60.00)
Glucose, Bld: 95 mg/dL (ref 70–99)
Potassium: 4 mEq/L (ref 3.5–5.1)
Sodium: 140 mEq/L (ref 135–145)
TOTAL PROTEIN: 7.2 g/dL (ref 6.0–8.3)

## 2016-07-08 LAB — LIPID PANEL
Cholesterol: 188 mg/dL (ref 0–200)
HDL: 37 mg/dL — AB (ref >39.00)
LDL Cholesterol: 134 mg/dL — ABNORMAL HIGH (ref 0–99)
NonHDL: 151.16
TRIGLYCERIDES: 87 mg/dL (ref 0.0–149.0)
Total CHOL/HDL Ratio: 5
VLDL: 17.4 mg/dL (ref 0.0–40.0)

## 2016-07-11 ENCOUNTER — Ambulatory Visit (INDEPENDENT_AMBULATORY_CARE_PROVIDER_SITE_OTHER): Payer: Federal, State, Local not specified - PPO | Admitting: Family Medicine

## 2016-07-11 ENCOUNTER — Encounter: Payer: Self-pay | Admitting: Family Medicine

## 2016-07-11 VITALS — BP 118/70 | HR 81 | Temp 98.4°F | Ht 75.5 in | Wt 289.2 lb

## 2016-07-11 DIAGNOSIS — Z0000 Encounter for general adult medical examination without abnormal findings: Secondary | ICD-10-CM

## 2016-07-11 NOTE — Progress Notes (Signed)
Subjective:    Patient ID: Eric Faulkner, male    DOB: Mar 20, 1980, 36 y.o.   MRN: [id number]  HPI   The patient is here for annual wellness exam and preventative care.    Reviewed labs in detail with pt .  Cholesterol slight higher than last year but still remain low risk. No early family history of CAD, CVA. Lab Results  Component Value Date   CHOL 188 07/08/2016   HDL 37.00 (L) 07/08/2016   LDLCALC 134 (H) 07/08/2016   TRIG 87.0 07/08/2016   CHOLHDL 5 07/08/2016   Exercise:  Was walking more, has had to decrease  Diet: moderate diet  Body mass index is 35.68 kg/m.  Wt Readings from Last 3 Encounters:  07/11/16 289 lb 4 oz (131.2 kg)  02/05/16 289 lb 8 oz (131.3 kg)  07/11/15 291 lb 8 oz (132.2 kg)    Allergic rhinitis:  Stable control on no medication.   Social History /Family History/Past Medical History reviewed in detail and updated in EMR if needed. Blood pressure 118/70, pulse 81, temperature 98.4 F (36.9 C), temperature source Oral, height 6' 3.5" (1.918 m), weight 289 lb 4 oz (131.2 kg), SpO2 95 %.  Review of Systems  Constitutional: Negative for fatigue and fever.  HENT: Negative for ear pain.   Eyes: Negative for pain.  Respiratory: Negative for cough and shortness of breath.   Cardiovascular: Negative for chest pain, palpitations and leg swelling.  Gastrointestinal: Negative for abdominal pain.  Genitourinary: Negative for dysuria.  Musculoskeletal: Negative for arthralgias.  Neurological: Negative for syncope, light-headedness and headaches.  Psychiatric/Behavioral: Negative for dysphoric mood.       Objective:   Physical Exam  Constitutional: He appears well-developed and well-nourished.  Non-toxic appearance. He does not appear ill. No distress.  obese appearing in NAD  HENT:  Head: Normocephalic and atraumatic.  Right Ear: Hearing, tympanic membrane, external ear and ear canal normal.  Left Ear: Hearing, tympanic membrane, external ear  and ear canal normal.  Nose: Nose normal.  Mouth/Throat: Uvula is midline, oropharynx is clear and moist and mucous membranes are normal.  Eyes: Conjunctivae, EOM and lids are normal. Pupils are equal, round, and reactive to light. Lids are everted and swept, no foreign bodies found.  Neck: Trachea normal, normal range of motion and phonation normal. Neck supple. Carotid bruit is not present. No thyroid mass and no thyromegaly present.  Cardiovascular: Normal rate, regular rhythm, S1 normal, S2 normal, intact distal pulses and normal pulses.  Exam reveals no gallop.   No murmur heard. Pulmonary/Chest: Breath sounds normal. He has no wheezes. He has no rhonchi. He has no rales.  Abdominal: Soft. Normal appearance and bowel sounds are normal. There is no hepatosplenomegaly. There is no tenderness. There is no rebound, no guarding and no CVA tenderness. No hernia.  Lymphadenopathy:    He has no cervical adenopathy.  Neurological: He is alert. He has normal strength and normal reflexes. No cranial nerve deficit or sensory deficit. Gait normal.  Skin: Skin is warm, dry and intact. No rash noted.  Psychiatric: He has a normal mood and affect. His speech is normal and behavior is normal. Judgment normal.          Assessment & Plan:  The patient's preventative maintenance and recommended screening tests for an annual wellness exam were reviewed in full today. Brought up to date unless services declined.  Counselled on the importance of diet, exercise, and its role in overall health  and mortality. The patient's FH and SH was reviewed, including their home life, tobacco status, and drug and alcohol status.   Vaccines: uptodate No family history of early prostate cancer, colon cancer.   Refused HIV screen  Nonsmoker:

## 2016-07-11 NOTE — Patient Instructions (Signed)
Work on increasing exercsie as able.  Decrease fried foods, fast food, animal fats.

## 2016-09-04 DIAGNOSIS — F411 Generalized anxiety disorder: Secondary | ICD-10-CM | POA: Diagnosis not present

## 2016-09-10 DIAGNOSIS — F411 Generalized anxiety disorder: Secondary | ICD-10-CM | POA: Diagnosis not present

## 2016-09-23 DIAGNOSIS — F411 Generalized anxiety disorder: Secondary | ICD-10-CM | POA: Diagnosis not present

## 2016-09-30 DIAGNOSIS — F411 Generalized anxiety disorder: Secondary | ICD-10-CM | POA: Diagnosis not present

## 2016-10-14 DIAGNOSIS — F411 Generalized anxiety disorder: Secondary | ICD-10-CM | POA: Diagnosis not present

## 2016-10-21 DIAGNOSIS — K080 Exfoliation of teeth due to systemic causes: Secondary | ICD-10-CM | POA: Diagnosis not present

## 2016-10-28 DIAGNOSIS — F411 Generalized anxiety disorder: Secondary | ICD-10-CM | POA: Diagnosis not present

## 2016-11-26 DIAGNOSIS — K080 Exfoliation of teeth due to systemic causes: Secondary | ICD-10-CM | POA: Diagnosis not present

## 2016-12-09 DIAGNOSIS — F411 Generalized anxiety disorder: Secondary | ICD-10-CM | POA: Diagnosis not present

## 2016-12-25 ENCOUNTER — Telehealth: Payer: Self-pay

## 2016-12-25 NOTE — Telephone Encounter (Signed)
I spoke with pt and he was carrying gallon of water and had rt forearm pain. Pt scheduled appt with Dr Milinda Antisower on 12/26/16 at 8 AM.

## 2016-12-25 NOTE — Telephone Encounter (Signed)
Left VM letting pt know Dr. Royden Purlower's instructions and recommendations

## 2016-12-25 NOTE — Telephone Encounter (Signed)
I will see him then  Put ice on the affected area tonight for 10 minutes at a time and rest it  Thanks

## 2016-12-25 NOTE — Telephone Encounter (Signed)
Copied from CRM [id number]. Topic: Referral - Request >> Dec 24, 2016  9:20 AM Crist InfanteHarrald, Kathy J wrote: Reason for CRM: pt states he is having right forearm pain, closer to the elbow. Would like to know who you can refer him to that is in [facility]

## 2016-12-26 ENCOUNTER — Ambulatory Visit: Payer: Federal, State, Local not specified - PPO | Admitting: Family Medicine

## 2016-12-26 ENCOUNTER — Encounter: Payer: Self-pay | Admitting: Family Medicine

## 2016-12-26 ENCOUNTER — Encounter (INDEPENDENT_AMBULATORY_CARE_PROVIDER_SITE_OTHER): Payer: Self-pay

## 2016-12-26 VITALS — BP 110/62 | HR 79 | Temp 98.2°F | Ht 75.5 in | Wt 290.2 lb

## 2016-12-26 DIAGNOSIS — M79639 Pain in unspecified forearm: Secondary | ICD-10-CM | POA: Insufficient documentation

## 2016-12-26 DIAGNOSIS — M79631 Pain in right forearm: Secondary | ICD-10-CM | POA: Diagnosis not present

## 2016-12-26 MED ORDER — MELOXICAM 15 MG PO TABS: 15.0000 mg | ORAL_TABLET | Freq: Every day | ORAL | 1 refills | Status: DC

## 2016-12-26 NOTE — Patient Instructions (Signed)
I think you have tendonitis in your forearm  Use ice (frozen bag of peas) for 10 minutes on and off whenever you can  Avoid heavy lifting  Try the meloxicam 15 mg with food once daily for 2 weeks (stop if it bothers your stomach)  If you are not significantly improved in 2 weeks please make an appt with Dr Patsy Lageropland   Keep us posted

## 2016-12-26 NOTE — Progress Notes (Signed)
Subjective:    Patient ID: Eric Faulkner, male    DOB: 1980-12-24, 36 y.o.   MRN: [id number]  HPI Here for R arm pain after ? Injury   3-4 weeks ago - carrying laptop bag and gallon jug of water (from 3 fingers)  No pain at the time - but later in the day  Pain started in the lower forearm - up to elbow (but elbow does not hurt)  More noticeable when extend wrist (also shoveling) Pain to lift with palm up  Pain quality is dull  No pain with rest or in bed (only when doing something)   Has never injured this arm before  No hx of tendonitis   Plan to start working out next year Runs in the ams  Wants to do some weight training   No pain medicine or nsaid  Has not tried ice or heat    [name]   Patient Active Problem List   Diagnosis Date Noted  . Forearm pain 12/26/2016  . Seasonal allergies 06/30/2014   Past Medical History:  Diagnosis Date  . Low back pain    History reviewed. No pertinent surgical history. Social History   Tobacco Use  . Smoking status: Former Smoker    Types: Cigars  . Smokeless tobacco: Never Used  Substance Use Topics  . Alcohol use: No    Alcohol/week: 0.0 oz  . Drug use: No   Family History  Problem Relation Age of Onset  . Cancer Mother 7040       breast  . Cancer Maternal Grandfather        throat  . ALS Paternal Uncle    No Known Allergies No current outpatient medications on file prior to visit.   No current facility-administered medications on file prior to visit.     Review of Systems  Constitutional: Negative for activity change, appetite change, fatigue, fever and unexpected weight change.  HENT: Negative for congestion, rhinorrhea, sore throat and trouble swallowing.   Eyes: Negative for pain, redness, itching and visual disturbance.  Respiratory: Negative for cough, chest tightness, shortness of breath and wheezing.   Cardiovascular: Negative for chest  pain and palpitations.  Gastrointestinal: Negative for abdominal pain, blood in stool, constipation, diarrhea and nausea.  Endocrine: Negative for cold intolerance, heat intolerance, polydipsia and polyuria.  Genitourinary: Negative for difficulty urinating, dysuria, frequency and urgency.  Musculoskeletal: Negative for arthralgias, joint swelling and myalgias.       Pos for R forearm pain   Skin: Negative for pallor and rash.  Neurological: Negative for dizziness, tremors, weakness, numbness and headaches.  Hematological: Negative for adenopathy. Does not bruise/bleed easily.  Psychiatric/Behavioral: Negative for decreased concentration and dysphoric mood. The patient is not nervous/anxious.        Objective:   Physical Exam  Constitutional: He appears well-developed and well-nourished. No distress.  Well appearing  overwt   HENT:  Head: Normocephalic and atraumatic.  Neck: Normal range of motion. Neck supple.  Musculoskeletal: He exhibits tenderness. He exhibits no edema or deformity.       Right forearm: He exhibits tenderness. He exhibits no bony tenderness, no swelling, no edema, no deformity and no laceration.  R forearm Tender over anterior forearm with deep palpation -no palp cord/erythema/warmth or swelling Pain on full extension of wrist with outstretched arm and extension with resistance  No elbow or wrist or hand tenderness or deformity  Nl radial /ulnar pulses  Nl sens/strength  Neurological: He is alert. He has normal strength and normal reflexes. He displays no atrophy. No sensory deficit. He exhibits normal muscle tone.  Neg tinel and Phalen tests   Skin: Skin is warm and dry. No rash noted. No erythema. No pallor.  Psychiatric: He has a normal mood and affect.          Assessment & Plan:   Problem List Items Addressed This Visit      Other   Forearm pain    Suspect mild flexor tendon injury/tendonitis brought on by heavy lifting  Recommend cold compress  10 min at a time when able Relative rest from heavy lifting meloxicam 15 mg daily with food for about 2 weeks If not improved/resolved in 2 wk - f/u with Dr Patsy Lageropland for re eval (or if symptoms worsen) Re assuring exam

## 2016-12-26 NOTE — Assessment & Plan Note (Signed)
Suspect mild flexor tendon injury/tendonitis brought on by heavy lifting  Recommend cold compress 10 min at a time when able Relative rest from heavy lifting meloxicam 15 mg daily with food for about 2 weeks If not improved/resolved in 2 wk - f/u with Dr Patsy Lageropland for re eval (or if symptoms worsen) Re assuring exam

## 2017-01-27 ENCOUNTER — Telehealth: Payer: Self-pay | Admitting: Family Medicine

## 2017-01-27 NOTE — Telephone Encounter (Signed)
Form placed in Dr. Bedsole's in box to complete. 

## 2017-01-27 NOTE — Telephone Encounter (Signed)
Pt brought [employer] Recommendation for Exercise form for Bedsoled to complete. Call pt when ready [phone].

## 2017-01-28 DIAGNOSIS — Z0279 Encounter for issue of other medical certificate: Secondary | ICD-10-CM

## 2017-01-29 NOTE — Telephone Encounter (Signed)
[name] notified form is ready to be picked up at the front desk.

## 2017-04-14 DIAGNOSIS — F411 Generalized anxiety disorder: Secondary | ICD-10-CM | POA: Diagnosis not present

## 2017-04-14 DIAGNOSIS — F329 Major depressive disorder, single episode, unspecified: Secondary | ICD-10-CM | POA: Diagnosis not present

## 2017-04-14 DIAGNOSIS — Z630 Problems in relationship with spouse or partner: Secondary | ICD-10-CM | POA: Diagnosis not present

## 2017-07-08 ENCOUNTER — Other Ambulatory Visit: Payer: Federal, State, Local not specified - PPO

## 2017-07-15 ENCOUNTER — Encounter: Payer: Federal, State, Local not specified - PPO | Admitting: Family Medicine

## 2018-03-12 ENCOUNTER — Telehealth: Payer: Self-pay | Admitting: Family Medicine

## 2018-03-12 DIAGNOSIS — Z13220 Encounter for screening for lipoid disorders: Secondary | ICD-10-CM

## 2018-03-12 DIAGNOSIS — R5383 Other fatigue: Secondary | ICD-10-CM

## 2018-03-12 NOTE — Telephone Encounter (Signed)
Best number [phone] Pt has cpx labs 03/30/2018 And wanted to know if he could have his thyroid checked also.

## 2018-03-12 NOTE — Telephone Encounter (Signed)
Yes.. what is reason? Family history? Fatigue, constipation,dry skin, swelling, cold or heat intolerance, palpitations, weight loss or gain?  I need a reason.

## 2018-03-12 NOTE — Telephone Encounter (Signed)
Spoke with Eric Faulkner.  He states he is having fatigue and has also been diagnosed with a mood disorder.

## 2018-03-27 ENCOUNTER — Other Ambulatory Visit: Payer: Self-pay

## 2018-03-30 ENCOUNTER — Other Ambulatory Visit: Payer: Self-pay

## 2018-04-07 ENCOUNTER — Encounter: Payer: Self-pay | Admitting: Family Medicine

## 2018-04-23 ENCOUNTER — Other Ambulatory Visit: Payer: Self-pay

## 2018-04-30 ENCOUNTER — Encounter: Payer: Self-pay | Admitting: Family Medicine

## 2018-05-27 ENCOUNTER — Encounter: Payer: Self-pay | Admitting: Family Medicine

## 2018-05-27 ENCOUNTER — Ambulatory Visit (INDEPENDENT_AMBULATORY_CARE_PROVIDER_SITE_OTHER): Payer: Federal, State, Local not specified - PPO | Admitting: Family Medicine

## 2018-05-27 VITALS — HR 74

## 2018-05-27 DIAGNOSIS — L309 Dermatitis, unspecified: Secondary | ICD-10-CM | POA: Diagnosis not present

## 2018-05-27 MED ORDER — CLOTRIMAZOLE-BETAMETHASONE 1-0.05 % EX CREA: 1.0000 "application " | TOPICAL_CREAM | Freq: Two times a day (BID) | CUTANEOUS | 0 refills | Status: DC

## 2018-05-27 NOTE — Progress Notes (Signed)
[name], MD Primary Care and Sports Medicine [facility] [address] [place], [id number] Phone: [phone]  FAX: [phone]  Eric Faulkner - 38 y.o. male  MRN [id number]  Date of Birth: 18-Mar-1980  Visit Date: 05/27/2018  PCP: Excell SeltzerBedsole, Amy E, MD  Referred by: Excell SeltzerBedsole, Amy E, MD Chief Complaint  Patient presents with  . Itching under left toes    x 1 month   Virtual Visit via Video Note:  I connected with  Eric Faulkner on 05/27/2018  9:40 AM EDT by a video enabled telemedicine application and verified that I am speaking with the correct person using two identifiers.   Location patient: home computer, tablet, or smartphone Location provider: work or home office Consent: Verbal consent directly obtained from Eric Faulkner. Persons participating in the virtual visit: patient, provider  I discussed the limitations of evaluation and management by telemedicine and the availability of in person appointments. The patient expressed understanding and agreed to proceed.  History of Present Illness:  Rash at the base of his L toes for > 1 month.  He has tried some tinactin without success. It is itchy and irritated. O/w healthy  Patient Active Problem List   Diagnosis Date Noted  . Forearm pain 12/26/2016  . Seasonal allergies 06/30/2014    Past Medical History:  Diagnosis Date  . Low back pain     History reviewed. No pertinent surgical history.  Social History   Socioeconomic History  . Marital status: Married    Spouse name: Not on file  . Number of children: Not on file  . Years of education: Not on file  . Highest education level: Not on file  Occupational History  . Not on file  Social Needs  . Financial resource strain: Not on file  . Food insecurity:    Worry: Not on file    Inability: Not on file  . Transportation needs:    Medical: Not on file    Non-medical: Not on file  Tobacco Use   . Smoking status: Former Smoker    Types: Cigars  . Smokeless tobacco: Never Used  Substance and Sexual Activity  . Alcohol use: No    Alcohol/week: 0.0 standard drinks  . Drug use: No  . Sexual activity: Yes  Lifestyle  . Physical activity:    Days per week: Not on file    Minutes per session: Not on file  . Stress: Not on file  Relationships  . Social connections:    Talks on phone: Not on file    Gets together: Not on file    Attends religious service: Not on file    Active member of club or organization: Not on file    Attends meetings of clubs or organizations: Not on file    Relationship status: Not on file  . Intimate partner violence:    Fear of current or ex partner: Not on file    Emotionally abused: Not on file    Physically abused: Not on file    Forced sexual activity: Not on file  Other Topics Concern  . 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   Married, 3 kids  ( 18 months, 6931yrs and 11 yrs)       Family History  Problem Relation Age of Onset  . Cancer Mother 2440       breast  . Cancer  Maternal Grandfather        throat  . ALS Paternal Uncle     No Known Allergies  Medication list reviewed and updated in full in [facility].   Review of Systems as above: See pertinent positives and pertinent negatives per HPI No acute distress verbally  Past Medical History, Surgical History, Social History, Family History, Problem List, Medications, and Allergies have been reviewed and updated if relevant.   Observations/Objective/Exam:  An attempt was made to discern vital signs over the phone and per patient if applicable and possible.   General:    Alert, Oriented, appears well and in no acute distress HEENT:     Atraumatic, conjunctiva clear, no obvious abnormalities on inspection of external nose and ears.  Neck:    Normal movements of the head and neck Pulmonary:     On inspection no signs of respiratory distress, breathing  rate appears normal, no obvious gross SOB, gasping or wheezing Cardiovascular:    No obvious cyanosis Musculoskeletal:    Moves all visible extremities without noticeable abnormality Psych / Neurological:     Pleasant and cooperative, no obvious depression or anxiety, speech and thought processing grossly intact  Assessment and Plan:  Dermatitis  >10 minutes spent in face to face time with patient, >50% spent in counselling or coordination of care   Dermatitis NOS, cannot exclude fungal Air out feet  I discussed the assessment and treatment plan with the patient. The patient was provided an opportunity to ask questions and all were answered. The patient agreed with the plan and demonstrated an understanding of the instructions.   The patient was advised to call back or seek an in-person evaluation if the symptoms worsen or if the condition fails to improve as anticipated.  Follow-up: prn unless noted otherwise below No follow-ups on file.  Meds ordered this encounter  Medications  . clotrimazole-betamethasone (LOTRISONE) cream    Sig: Apply 1 application topically 2 (two) times daily.    Dispense:  15 g    Refill:  0   No orders of the defined types were placed in this encounter.   Signed,  Elpidio Galea. Rahman Ferrall, MD

## 2018-06-22 DIAGNOSIS — F3175 Bipolar disorder, in partial remission, most recent episode depressed: Secondary | ICD-10-CM | POA: Diagnosis not present

## 2018-07-06 DIAGNOSIS — F3175 Bipolar disorder, in partial remission, most recent episode depressed: Secondary | ICD-10-CM | POA: Diagnosis not present

## 2018-07-20 DIAGNOSIS — F3175 Bipolar disorder, in partial remission, most recent episode depressed: Secondary | ICD-10-CM | POA: Diagnosis not present

## 2018-07-31 ENCOUNTER — Other Ambulatory Visit (INDEPENDENT_AMBULATORY_CARE_PROVIDER_SITE_OTHER): Payer: Federal, State, Local not specified - PPO

## 2018-07-31 ENCOUNTER — Other Ambulatory Visit: Payer: Self-pay

## 2018-07-31 DIAGNOSIS — R5383 Other fatigue: Secondary | ICD-10-CM | POA: Diagnosis not present

## 2018-07-31 LAB — VITAMIN B12: Vitamin B-12: 319 pg/mL (ref 211–911)

## 2018-07-31 LAB — TSH: TSH: 1.35 u[IU]/mL (ref 0.35–4.50)

## 2018-07-31 LAB — CBC WITH DIFFERENTIAL/PLATELET
Basophils Absolute: 0 10*3/uL (ref 0.0–0.1)
Basophils Relative: 0.7 % (ref 0.0–3.0)
Eosinophils Absolute: 0.2 10*3/uL (ref 0.0–0.7)
Eosinophils Relative: 3.2 % (ref 0.0–5.0)
HCT: 42 % (ref 39.0–52.0)
Hemoglobin: 13.8 g/dL (ref 13.0–17.0)
Lymphocytes Relative: 41.4 % (ref 12.0–46.0)
Lymphs Abs: 2.9 10*3/uL (ref 0.7–4.0)
MCHC: 32.9 g/dL (ref 30.0–36.0)
MCV: 80 fl (ref 78.0–100.0)
Monocytes Absolute: 0.6 10*3/uL (ref 0.1–1.0)
Monocytes Relative: 9.2 % (ref 3.0–12.0)
Neutro Abs: 3.2 10*3/uL (ref 1.4–7.7)
Neutrophils Relative %: 45.5 % (ref 43.0–77.0)
Platelets: 245 10*3/uL (ref 150.0–400.0)
RBC: 5.25 Mil/uL (ref 4.22–5.81)
RDW: 15.4 % (ref 11.5–15.5)
WBC: 7 10*3/uL (ref 4.0–10.5)

## 2018-07-31 LAB — T3, FREE: T3, Free: 2.6 pg/mL (ref 2.3–4.2)

## 2018-07-31 LAB — T4, FREE: Free T4: 0.72 ng/dL (ref 0.60–1.60)

## 2018-08-03 ENCOUNTER — Telehealth: Payer: Self-pay | Admitting: Family Medicine

## 2018-08-03 DIAGNOSIS — F3175 Bipolar disorder, in partial remission, most recent episode depressed: Secondary | ICD-10-CM | POA: Diagnosis not present

## 2018-08-03 NOTE — Telephone Encounter (Signed)
Patient is calling in regards to his labs.  Patient stated that he is needing these sent to his my chart for his upcoming appointment with his dietitian    Please advise  C/B # [phone]

## 2018-08-03 NOTE — Telephone Encounter (Signed)
Sent per patient's request. Patient aware and has received them.

## 2018-08-04 DIAGNOSIS — Z713 Dietary counseling and surveillance: Secondary | ICD-10-CM | POA: Diagnosis not present

## 2018-08-06 ENCOUNTER — Ambulatory Visit (INDEPENDENT_AMBULATORY_CARE_PROVIDER_SITE_OTHER): Payer: Federal, State, Local not specified - PPO | Admitting: Family Medicine

## 2018-08-06 ENCOUNTER — Encounter: Payer: Self-pay | Admitting: Family Medicine

## 2018-08-06 ENCOUNTER — Other Ambulatory Visit: Payer: Self-pay

## 2018-08-06 VITALS — BP 118/72 | HR 79 | Temp 98.1°F | Ht 75.5 in | Wt 299.5 lb

## 2018-08-06 DIAGNOSIS — R5383 Other fatigue: Secondary | ICD-10-CM | POA: Diagnosis not present

## 2018-08-06 DIAGNOSIS — F3170 Bipolar disorder, currently in remission, most recent episode unspecified: Secondary | ICD-10-CM | POA: Diagnosis not present

## 2018-08-06 DIAGNOSIS — F319 Bipolar disorder, unspecified: Secondary | ICD-10-CM | POA: Insufficient documentation

## 2018-08-06 DIAGNOSIS — Z13220 Encounter for screening for lipoid disorders: Secondary | ICD-10-CM | POA: Diagnosis not present

## 2018-08-06 DIAGNOSIS — Z0000 Encounter for general adult medical examination without abnormal findings: Secondary | ICD-10-CM | POA: Diagnosis not present

## 2018-08-06 LAB — LIPID PANEL
Cholesterol: 200 mg/dL (ref 0–200)
HDL: 40.6 mg/dL (ref >39.00)
LDL Cholesterol: 140 mg/dL — ABNORMAL HIGH (ref 0–99)
NonHDL: 159.09
Total CHOL/HDL Ratio: 5
Triglycerides: 97 mg/dL (ref 0.0–149.0)
VLDL: 19.4 mg/dL (ref 0.0–40.0)

## 2018-08-06 LAB — COMPREHENSIVE METABOLIC PANEL
ALT: 24 U/L (ref 0–53)
AST: 21 U/L (ref 0–37)
Albumin: 4.2 g/dL (ref 3.5–5.2)
Alkaline Phosphatase: 61 U/L (ref 39–117)
BUN: 16 mg/dL (ref 6–23)
CO2: 28 mEq/L (ref 19–32)
Calcium: 9.3 mg/dL (ref 8.4–10.5)
Chloride: 106 mEq/L (ref 96–112)
Creatinine, Ser: 1.24 mg/dL (ref 0.40–1.50)
GFR: 78.25 mL/min (ref >60.00)
Glucose, Bld: 86 mg/dL (ref 70–99)
Potassium: 3.9 mEq/L (ref 3.5–5.1)
Sodium: 140 mEq/L (ref 135–145)
Total Bilirubin: 0.7 mg/dL (ref 0.2–1.2)
Total Protein: 7.4 g/dL (ref 6.0–8.3)

## 2018-08-06 LAB — VITAMIN D 25 HYDROXY (VIT D DEFICIENCY, FRACTURES): VITD: 20.43 ng/mL — ABNORMAL LOW (ref 30.00–100.00)

## 2018-08-06 NOTE — Assessment & Plan Note (Signed)
Breakdown  1 year .. ago.. new diagnosis... this medication ahs been very helpful.   Sees psychiatrist every 3 moths.  Therapist every 2 weeks.

## 2018-08-06 NOTE — Progress Notes (Signed)
Chief Complaint  Patient presents with  . Annual Exam    History of Present Illness: HPI The patient is here for annual wellness exam and preventative care.   Due for re-eval with labs.   Exercise: walking daily  Diet: Seeing dietician Wt Readings from Last 3 Encounters:  08/06/18 299 lb 8 oz (135.9 kg)  12/26/16 290 lb 4 oz (131.7 kg)  07/11/16 289 lb 4 oz (131.2 kg)  Body mass index is 36.94 kg/m.   He has started back 2 cigars a day in last 30 days. Gives him a dry cough at times. Separated from wife.  He is on Seroquel and sertraline.. managed by psychiatry.  COVID 19 screen No recent travel or known exposure to COVID19 The patient denies respiratory symptoms of COVID 19 at this time.  The importance of social distancing was discussed today.   Review of Systems  Constitutional: Negative for chills and fever.  HENT: Negative for congestion and ear pain.   Eyes: Negative for pain and redness.  Respiratory: Negative for cough and shortness of breath.   Cardiovascular: Negative for chest pain, palpitations and leg swelling.  Gastrointestinal: Negative for abdominal pain, blood in stool, constipation, diarrhea, nausea and vomiting.  Genitourinary: Negative for dysuria.  Musculoskeletal: Negative for falls and myalgias.  Skin: Negative for rash.  Neurological: Negative for dizziness.  Psychiatric/Behavioral: Negative for depression. The patient is not nervous/anxious.       Past Medical History:  Diagnosis Date  . Low back pain     reports that he has quit smoking. His smoking use included cigars. He has never used smokeless tobacco. He reports that he does not drink alcohol or use drugs.   Current Outpatient Medications:  .  QUEtiapine (SEROQUEL) 25 MG tablet, , Disp: , Rfl:  .  sertraline (ZOLOFT) 25 MG tablet, , Disp: , Rfl:    Observations/Objective: Blood pressure 118/72, pulse 79, temperature 98.1 F (36.7 C), temperature source Temporal, height 6' 3.5"  (1.918 m), weight 299 lb 8 oz (135.9 kg), SpO2 96 %.  Physical Exam Constitutional:      General: He is not in acute distress.    Appearance: Normal appearance. He is well-developed. He is not ill-appearing or toxic-appearing.  HENT:     Head: Normocephalic and atraumatic.     Right Ear: Hearing, tympanic membrane, ear canal and external ear normal.     Left Ear: Hearing, tympanic membrane, ear canal and external ear normal.     Nose: Nose normal.     Mouth/Throat:     Pharynx: Uvula midline.  Eyes:     General: Lids are normal. Lids are everted, no foreign bodies appreciated.     Conjunctiva/sclera: Conjunctivae normal.     Pupils: Pupils are equal, round, and reactive to light.  Neck:     Musculoskeletal: Normal range of motion and neck supple.     Thyroid: No thyroid mass or thyromegaly.     Vascular: No carotid bruit.     Trachea: Trachea and phonation normal.  Cardiovascular:     Rate and Rhythm: Normal rate and regular rhythm.     Pulses: Normal pulses.     Heart sounds: S1 normal and S2 normal. No murmur. No gallop.   Pulmonary:     Breath sounds: Normal breath sounds. No wheezing, rhonchi or rales.  Abdominal:     General: Bowel sounds are normal.     Palpations: Abdomen is soft.     Tenderness: There is  no abdominal tenderness. There is no guarding or rebound.     Hernia: No hernia is present.  Lymphadenopathy:     Cervical: No cervical adenopathy.  Skin:    General: Skin is warm and dry.     Findings: No rash.  Neurological:     Mental Status: He is alert.     Cranial Nerves: No cranial nerve deficit.     Sensory: No sensory deficit.     Gait: Gait normal.     Deep Tendon Reflexes: Reflexes are normal and symmetric.  Psychiatric:        Speech: Speech normal.        Behavior: Behavior normal.        Judgment: Judgment normal.      Assessment and Plan The patient's preventative maintenance and recommended screening tests for an annual wellness exam were  reviewed in full today. Brought up to date unless services declined.  Counselled on the importance of diet, exercise, and its role in overall health and mortality. The patient's FH and SH was reviewed, including their home life, tobacco status, and drug and alcohol status.   Vaccines: uptodate 2017 tdap No family history of early prostate cancer, colon cancer.  Refused HIV screen Cigar smoker.   Bipolar disorder in remission ([place])  Breakdown  1 year .. ago.. new diagnosis... this medication ahs been very helpful.   Sees psychiatrist every 3 moths.  Therapist every 2 weeks.      Eliezer Lofts, MD

## 2018-08-06 NOTE — Patient Instructions (Signed)
Keep working on [employer], regualr exercsie and weight loss.   Heart-Healthy Eating Plan Many factors influence your heart (coronary) health, including eating and exercise habits. Coronary risk increases with abnormal blood fat (lipid) levels. Heart-healthy meal planning includes limiting unhealthy fats, increasing healthy fats, and making other diet and lifestyle changes. What are tips for following this plan? Cooking Cook foods using methods other than frying. Baking, boiling, grilling, and broiling are all good options. Other ways to reduce fat include:  Removing the skin from poultry.  Removing all visible fats from meats.  Steaming vegetables in water or broth. Meal planning   At meals, imagine dividing your plate into fourths: ? Fill one-half of your plate with vegetables and green salads. ? Fill one-fourth of your plate with whole grains. ? Fill one-fourth of your plate with lean protein foods.  Eat 4-5 servings of vegetables per day. One serving equals 1 cup raw or cooked vegetable, or 2 cups raw leafy greens.  Eat 4-5 servings of fruit per day. One serving equals 1 medium whole fruit,  cup dried fruit,  cup fresh, frozen, or canned fruit, or  cup 100% fruit juice.  Eat more foods that contain soluble fiber. Examples include apples, broccoli, carrots, beans, peas, and barley. Aim to get 25-30 g of fiber per day.  Increase your consumption of legumes, nuts, and seeds to 4-5 servings per week. One serving of dried beans or legumes equals  cup cooked, 1 serving of nuts is  cup, and 1 serving of seeds equals 1 tablespoon. Fats  Choose healthy fats more often. Choose monounsaturated and polyunsaturated fats, such as olive and canola oils, flaxseeds, walnuts, almonds, and seeds.  Eat more omega-3 fats. Choose salmon, mackerel, sardines, tuna, flaxseed oil, and ground flaxseeds. Aim to eat fish at least 2 times each week.  Check food labels carefully to identify foods  with trans fats or high amounts of saturated fat.  Limit saturated fats. These are found in animal products, such as meats, butter, and cream. Plant sources of saturated fats include palm oil, palm kernel oil, and coconut oil.  Avoid foods with partially hydrogenated oils in them. These contain trans fats. Examples are stick margarine, some tub margarines, cookies, crackers, and other baked goods.  Avoid fried foods. General information  Eat more home-cooked food and less restaurant, buffet, and fast food.  Limit or avoid alcohol.  Limit foods that are high in starch and sugar.  Lose weight if you are overweight. Losing just 5-10% of your body weight can help your overall health and prevent diseases such as diabetes and heart disease.  Monitor your salt (sodium) intake, especially if you have high blood pressure. Talk with your health care provider about your sodium intake.  Try to incorporate more vegetarian meals weekly. What foods can I eat? Fruits All fresh, canned (in natural juice), or frozen fruits. Vegetables Fresh or frozen vegetables (raw, steamed, roasted, or grilled). Green salads. Grains Most grains. Choose whole wheat and whole grains most of the time. Rice and pasta, including brown rice and pastas made with whole wheat. Meats and other proteins Lean, well-trimmed beef, veal, pork, and lamb. Chicken and [place] without skin. All fish and shellfish. Wild duck, rabbit, pheasant, and venison. Egg whites or low-cholesterol egg substitutes. Dried beans, peas, lentils, and tofu. Seeds and most nuts. Dairy Low-fat or nonfat cheeses, including ricotta and mozzarella. Skim or 1% milk (liquid, powdered, or evaporated). Buttermilk made with low-fat milk. Nonfat or low-fat yogurt.  Fats and oils Non-hydrogenated (trans-free) margarines. Vegetable oils, including soybean, sesame, sunflower, olive, peanut, safflower, corn, canola, and cottonseed. Salad dressings or mayonnaise made with  a vegetable oil. Beverages Water (mineral or sparkling). Coffee and tea. Diet carbonated beverages. Sweets and desserts Sherbet, gelatin, and fruit ice. Small amounts of dark chocolate. Limit all sweets and desserts. Seasonings and condiments All seasonings and condiments. The items listed above may not be a complete list of foods and beverages you can eat. Contact a dietitian for more options. What foods are not recommended? Fruits Canned fruit in heavy syrup. Fruit in cream or butter sauce. Fried fruit. Limit coconut. Vegetables Vegetables cooked in cheese, cream, or butter sauce. Fried vegetables. Grains Breads made with saturated or trans fats, oils, or whole milk. Croissants. Sweet rolls. Donuts. High-fat crackers, such as cheese crackers. Meats and other proteins Fatty meats, such as hot dogs, ribs, sausage, bacon, rib-eye roast or steak. High-fat deli meats, such as salami and bologna. Caviar. Domestic duck and goose. Organ meats, such as liver. Dairy Cream, sour cream, cream cheese, and creamed cottage cheese. Whole milk cheeses. Whole or 2% milk (liquid, evaporated, or condensed). Whole buttermilk. Cream sauce or high-fat cheese sauce. Whole-milk yogurt. Fats and oils Meat fat, or shortening. Cocoa butter, hydrogenated oils, palm oil, coconut oil, palm kernel oil. Solid fats and shortenings, including bacon fat, salt pork, lard, and butter. Nondairy cream substitutes. Salad dressings with cheese or sour cream. Beverages Regular sodas and any drinks with added sugar. Sweets and desserts Frosting. Pudding. Cookies. Cakes. Pies. Milk chocolate or white chocolate. Buttered syrups. Full-fat ice cream or ice cream drinks. The items listed above may not be a complete list of foods and beverages to avoid. Contact a dietitian for more information. Summary  Heart-healthy meal planning includes limiting unhealthy fats, increasing healthy fats, and making other diet and lifestyle changes.   Lose weight if you are overweight. Losing just 5-10% of your body weight can help your overall health and prevent diseases such as diabetes and heart disease.  Focus on eating a balance of foods, including fruits and vegetables, low-fat or nonfat dairy, lean protein, nuts and legumes, whole grains, and heart-healthy oils and fats. This information is not intended to replace advice given to you by your health care provider. Make sure you discuss any questions you have with your health care provider. Document Released: 10/10/2007 Document Revised: 02/07/2017 Document Reviewed: 02/07/2017 Elsevier Patient Education  2020 [employer]

## 2018-08-17 DIAGNOSIS — F3175 Bipolar disorder, in partial remission, most recent episode depressed: Secondary | ICD-10-CM | POA: Diagnosis not present

## 2018-08-19 DIAGNOSIS — Z713 Dietary counseling and surveillance: Secondary | ICD-10-CM | POA: Diagnosis not present

## 2018-09-23 DIAGNOSIS — Z713 Dietary counseling and surveillance: Secondary | ICD-10-CM | POA: Diagnosis not present

## 2018-09-28 DIAGNOSIS — F3175 Bipolar disorder, in partial remission, most recent episode depressed: Secondary | ICD-10-CM | POA: Diagnosis not present

## 2018-10-14 DIAGNOSIS — Z713 Dietary counseling and surveillance: Secondary | ICD-10-CM | POA: Diagnosis not present

## 2018-10-27 DIAGNOSIS — F3175 Bipolar disorder, in partial remission, most recent episode depressed: Secondary | ICD-10-CM | POA: Diagnosis not present

## 2018-11-11 DIAGNOSIS — Z713 Dietary counseling and surveillance: Secondary | ICD-10-CM | POA: Diagnosis not present

## 2018-11-23 DIAGNOSIS — F3175 Bipolar disorder, in partial remission, most recent episode depressed: Secondary | ICD-10-CM | POA: Diagnosis not present

## 2018-12-03 ENCOUNTER — Other Ambulatory Visit: Payer: Self-pay

## 2018-12-03 DIAGNOSIS — Z20822 Contact with and (suspected) exposure to covid-19: Secondary | ICD-10-CM

## 2018-12-05 LAB — NOVEL CORONAVIRUS, NAA: SARS-CoV-2, NAA: DETECTED — AB

## 2018-12-21 DIAGNOSIS — F3175 Bipolar disorder, in partial remission, most recent episode depressed: Secondary | ICD-10-CM | POA: Diagnosis not present

## 2018-12-23 DIAGNOSIS — Z713 Dietary counseling and surveillance: Secondary | ICD-10-CM | POA: Diagnosis not present

## 2019-01-05 ENCOUNTER — Other Ambulatory Visit: Payer: Federal, State, Local not specified - PPO

## 2019-02-25 DIAGNOSIS — Z713 Dietary counseling and surveillance: Secondary | ICD-10-CM | POA: Diagnosis not present

## 2019-03-30 DIAGNOSIS — Z713 Dietary counseling and surveillance: Secondary | ICD-10-CM | POA: Diagnosis not present

## 2019-04-27 DIAGNOSIS — Z713 Dietary counseling and surveillance: Secondary | ICD-10-CM | POA: Diagnosis not present

## 2019-04-28 DIAGNOSIS — F411 Generalized anxiety disorder: Secondary | ICD-10-CM | POA: Diagnosis not present

## 2019-04-29 DIAGNOSIS — F3175 Bipolar disorder, in partial remission, most recent episode depressed: Secondary | ICD-10-CM | POA: Diagnosis not present

## 2019-05-25 DIAGNOSIS — Z713 Dietary counseling and surveillance: Secondary | ICD-10-CM | POA: Diagnosis not present

## 2019-06-02 DIAGNOSIS — Z03818 Encounter for observation for suspected exposure to other biological agents ruled out: Secondary | ICD-10-CM | POA: Diagnosis not present

## 2019-06-02 DIAGNOSIS — Z20822 Contact with and (suspected) exposure to covid-19: Secondary | ICD-10-CM | POA: Diagnosis not present

## 2019-06-22 DIAGNOSIS — Z713 Dietary counseling and surveillance: Secondary | ICD-10-CM | POA: Diagnosis not present

## 2019-07-28 DIAGNOSIS — F3175 Bipolar disorder, in partial remission, most recent episode depressed: Secondary | ICD-10-CM | POA: Diagnosis not present

## 2019-08-02 ENCOUNTER — Other Ambulatory Visit: Payer: Federal, State, Local not specified - PPO

## 2019-08-02 ENCOUNTER — Telehealth: Payer: Self-pay | Admitting: Family Medicine

## 2019-08-02 DIAGNOSIS — Z13220 Encounter for screening for lipoid disorders: Secondary | ICD-10-CM

## 2019-08-02 NOTE — Telephone Encounter (Signed)
-----   Message from Alvina Chou sent at 07/22/2019  2:49 PM EDT ----- Regarding: Lab orders for Monday, 7.19.21 Patient is scheduled for CPX labs, please order future labs, Thanks , Camelia Eng

## 2019-08-05 ENCOUNTER — Telehealth: Payer: Self-pay | Admitting: Family Medicine

## 2019-08-05 DIAGNOSIS — Z13220 Encounter for screening for lipoid disorders: Secondary | ICD-10-CM

## 2019-08-05 NOTE — Telephone Encounter (Signed)
-----   Message from Aquilla Solian, RT sent at 07/26/2019  3:34 PM EDT ----- Regarding: Lab Orders for Friday 7.23.2021 Please place lab orders for Friday 7.23.2021, office visit for physical on Tuesday 8.3.2021 Thank you, Jones Bales RT(R)

## 2019-08-06 ENCOUNTER — Other Ambulatory Visit: Payer: Self-pay

## 2019-08-06 ENCOUNTER — Other Ambulatory Visit (INDEPENDENT_AMBULATORY_CARE_PROVIDER_SITE_OTHER): Payer: Federal, State, Local not specified - PPO

## 2019-08-06 DIAGNOSIS — Z13220 Encounter for screening for lipoid disorders: Secondary | ICD-10-CM

## 2019-08-06 LAB — COMPREHENSIVE METABOLIC PANEL
ALT: 11 U/L (ref 0–53)
AST: 11 U/L (ref 0–37)
Albumin: 4.2 g/dL (ref 3.5–5.2)
Alkaline Phosphatase: 67 U/L (ref 39–117)
BUN: 17 mg/dL (ref 6–23)
CO2: 28 mEq/L (ref 19–32)
Calcium: 9.3 mg/dL (ref 8.4–10.5)
Chloride: 107 mEq/L (ref 96–112)
Creatinine, Ser: 1.16 mg/dL (ref 0.40–1.50)
GFR: 84.09 mL/min (ref >60.00)
Glucose, Bld: 99 mg/dL (ref 70–99)
Potassium: 4.1 mEq/L (ref 3.5–5.1)
Sodium: 141 mEq/L (ref 135–145)
Total Bilirubin: 0.5 mg/dL (ref 0.2–1.2)
Total Protein: 6.9 g/dL (ref 6.0–8.3)

## 2019-08-06 LAB — LIPID PANEL
Cholesterol: 195 mg/dL (ref 0–200)
HDL: 38.4 mg/dL — ABNORMAL LOW (ref >39.00)
LDL Cholesterol: 143 mg/dL — ABNORMAL HIGH (ref 0–99)
NonHDL: 156.53
Total CHOL/HDL Ratio: 5
Triglycerides: 68 mg/dL (ref 0.0–149.0)
VLDL: 13.6 mg/dL (ref 0.0–40.0)

## 2019-08-06 NOTE — Progress Notes (Signed)
No critical labs need to be addressed urgently. We will discuss labs in detail at upcoming office visit.   

## 2019-08-10 ENCOUNTER — Encounter: Payer: Federal, State, Local not specified - PPO | Admitting: Family Medicine

## 2019-08-17 ENCOUNTER — Ambulatory Visit (INDEPENDENT_AMBULATORY_CARE_PROVIDER_SITE_OTHER): Payer: Federal, State, Local not specified - PPO | Admitting: Family Medicine

## 2019-08-17 ENCOUNTER — Encounter: Payer: Self-pay | Admitting: Family Medicine

## 2019-08-17 ENCOUNTER — Other Ambulatory Visit: Payer: Self-pay

## 2019-08-17 VITALS — BP 100/72 | HR 72 | Temp 97.8°F | Ht 75.5 in | Wt 266.5 lb

## 2019-08-17 DIAGNOSIS — Z0000 Encounter for general adult medical examination without abnormal findings: Secondary | ICD-10-CM

## 2019-08-17 DIAGNOSIS — F3170 Bipolar disorder, currently in remission, most recent episode unspecified: Secondary | ICD-10-CM | POA: Diagnosis not present

## 2019-08-17 NOTE — Patient Instructions (Addendum)
Keep up the good work diet changes and add cardiovascular exercise!

## 2019-08-17 NOTE — Progress Notes (Signed)
Chief Complaint  Patient presents with   Annual Exam    History of Present Illness: HPI The patient is here for annual wellness exam and preventative care.    Bipolar disorder: on sertraline and zoloft followed by psychiatry.  Healthier overall.  Exercise:  minimal  Reviewed labs in detail with patient. Lab Results  Component Value Date   CHOL 195 08/06/2019   HDL 38.40 (L) 08/06/2019   LDLCALC 143 (H) 08/06/2019   TRIG 68.0 08/06/2019   CHOLHDL 5 08/06/2019   The 10-year ASCVD risk score Denman George DC Jr., et al., 2013) is: 3.7%   Values used to calculate the score:     Age: 39 years     Sex: Male     Is Non-Hispanic African American: Yes     Diabetic: No     Tobacco smoker: Yes     Systolic Blood Pressure: 100 mmHg     Is BP treated: No     HDL Cholesterol: 38.4 mg/dL     Total Cholesterol: 195 mg/dL  Wt Readings from Last 3 Encounters:  08/17/19 266 lb 8 oz (120.9 kg)  08/06/18 299 lb 8 oz (135.9 kg)  12/26/16 290 lb 4 oz (131.7 kg)  Body mass index is 32.87 kg/m.    This visit occurred during the SARS-CoV-2 public health emergency.  Safety protocols were in place, including screening questions prior to the visit, additional usage of staff PPE, and extensive cleaning of exam room while observing appropriate contact time as indicated for disinfecting solutions.   COVID 19 screen:  No recent travel or known exposure to COVID19 The patient denies respiratory symptoms of COVID 19 at this time. The importance of social distancing was discussed today.     Review of Systems  Constitutional: Negative for chills and fever.  HENT: Negative for congestion and ear pain.   Eyes: Negative for pain and redness.  Respiratory: Negative for cough and shortness of breath.   Cardiovascular: Negative for chest pain, palpitations and leg swelling.  Gastrointestinal: Negative for abdominal pain, blood in stool, constipation, diarrhea, nausea and vomiting.  Genitourinary: Negative  for dysuria.  Musculoskeletal: Negative for falls and myalgias.  Skin: Negative for rash.  Neurological: Negative for dizziness.  Psychiatric/Behavioral: Negative for depression. The patient is not nervous/anxious.       Past Medical History:  Diagnosis Date   Low back pain     reports that he has been smoking cigars. He has never used smokeless tobacco. He reports that he does not drink alcohol and does not use drugs.   Current Outpatient Medications:    QUEtiapine (SEROQUEL) 50 MG tablet, Take 50 mg by mouth at bedtime., Disp: , Rfl:    sertraline (ZOLOFT) 25 MG tablet, , Disp: , Rfl:    Observations/Objective: Blood pressure 100/72, pulse 72, temperature 97.8 F (36.6 C), temperature source Temporal, height 6' 3.5" (1.918 m), weight 266 lb 8 oz (120.9 kg), SpO2 97 %.  Physical Exam Constitutional:      General: He is not in acute distress.    Appearance: Normal appearance. He is well-developed. He is not ill-appearing or toxic-appearing.  HENT:     Head: Normocephalic and atraumatic.     Right Ear: Hearing, tympanic membrane, ear canal and external ear normal.     Left Ear: Hearing, tympanic membrane, ear canal and external ear normal.     Nose: Nose normal.     Mouth/Throat:     Pharynx: Uvula midline.  Eyes:  General: Lids are normal. Lids are everted, no foreign bodies appreciated.     Conjunctiva/sclera: Conjunctivae normal.     Pupils: Pupils are equal, round, and reactive to light.  Neck:     Thyroid: No thyroid mass or thyromegaly.     Vascular: No carotid bruit.     Trachea: Trachea and phonation normal.  Cardiovascular:     Rate and Rhythm: Normal rate and regular rhythm.     Pulses: Normal pulses.     Heart sounds: S1 normal and S2 normal. No murmur heard.  No gallop.   Pulmonary:     Breath sounds: Normal breath sounds. No wheezing, rhonchi or rales.  Abdominal:     General: Bowel sounds are normal.     Palpations: Abdomen is soft.     Tenderness:  There is no abdominal tenderness. There is no guarding or rebound.     Hernia: No hernia is present.  Musculoskeletal:     Cervical back: Normal range of motion and neck supple.  Lymphadenopathy:     Cervical: No cervical adenopathy.  Skin:    General: Skin is warm and dry.     Findings: No rash.  Neurological:     Mental Status: He is alert.     Cranial Nerves: No cranial nerve deficit.     Sensory: No sensory deficit.     Gait: Gait normal.     Deep Tendon Reflexes: Reflexes are normal and symmetric.  Psychiatric:        Speech: Speech normal.        Behavior: Behavior normal.        Judgment: Judgment normal.      Assessment and Plan    The patient's preventative maintenance and recommended screening tests for an annual wellness exam were reviewed in full today. Brought up to date unless services declined.  Counselled on the importance of diet, exercise, and its role in overall health and mortality. The patient's FH and SH was reviewed, including their home life, tobacco status, and drug and alcohol status.   Vaccines:uptodate 2017 tdap,  Discussed COVID19 vaccine side effects and benefits. Strongly encouraged the patient to get the vaccine. Questions answered. Nofamily history ofearly prostate cancer, colon cancer. Refused HIV screen  Hep C: plan in future Cigar smoker... rare now.   Kerby Nora, MD

## 2019-08-24 DIAGNOSIS — Z713 Dietary counseling and surveillance: Secondary | ICD-10-CM | POA: Diagnosis not present

## 2019-08-25 DIAGNOSIS — F3175 Bipolar disorder, in partial remission, most recent episode depressed: Secondary | ICD-10-CM | POA: Diagnosis not present

## 2019-09-21 DIAGNOSIS — Z713 Dietary counseling and surveillance: Secondary | ICD-10-CM | POA: Diagnosis not present

## 2019-09-27 DIAGNOSIS — F3175 Bipolar disorder, in partial remission, most recent episode depressed: Secondary | ICD-10-CM | POA: Diagnosis not present

## 2019-10-25 DIAGNOSIS — F3175 Bipolar disorder, in partial remission, most recent episode depressed: Secondary | ICD-10-CM | POA: Diagnosis not present

## 2019-10-31 ENCOUNTER — Other Ambulatory Visit: Payer: Self-pay

## 2019-10-31 ENCOUNTER — Emergency Department
Admission: EM | Admit: 2019-10-31 | Discharge: 2019-10-31 | Disposition: A | Discharge status: Home / Self Care | Payer: Federal, State, Local not specified - PPO | Attending: Emergency Medicine | Admitting: Emergency Medicine

## 2019-10-31 ENCOUNTER — Emergency Department: Payer: Federal, State, Local not specified - PPO

## 2019-10-31 DIAGNOSIS — F17290 Nicotine dependence, other tobacco product, uncomplicated: Secondary | ICD-10-CM | POA: Diagnosis not present

## 2019-10-31 DIAGNOSIS — K2100 Gastro-esophageal reflux disease with esophagitis, without bleeding: Secondary | ICD-10-CM | POA: Diagnosis not present

## 2019-10-31 DIAGNOSIS — R079 Chest pain, unspecified: Secondary | ICD-10-CM | POA: Diagnosis not present

## 2019-10-31 LAB — CBC WITH DIFFERENTIAL/PLATELET
Abs Immature Granulocytes: 0.01 10*3/uL (ref 0.00–0.07)
Basophils Absolute: 0 10*3/uL (ref 0.0–0.1)
Basophils Relative: 1 %
Eosinophils Absolute: 0.2 10*3/uL (ref 0.0–0.5)
Eosinophils Relative: 3 %
HCT: 37 % — ABNORMAL LOW (ref 39.0–52.0)
Hemoglobin: 12.9 g/dL — ABNORMAL LOW (ref 13.0–17.0)
Immature Granulocytes: 0 %
Lymphocytes Relative: 33 %
Lymphs Abs: 2.4 10*3/uL (ref 0.7–4.0)
MCH: 26.1 pg (ref 26.0–34.0)
MCHC: 34.9 g/dL (ref 30.0–36.0)
MCV: 74.9 fL — ABNORMAL LOW (ref 80.0–100.0)
Monocytes Absolute: 0.9 10*3/uL (ref 0.1–1.0)
Monocytes Relative: 12 %
Neutro Abs: 3.6 10*3/uL (ref 1.7–7.7)
Neutrophils Relative %: 51 %
Platelets: 244 10*3/uL (ref 150–400)
RBC: 4.94 MIL/uL (ref 4.22–5.81)
RDW: 14.5 % (ref 11.5–15.5)
WBC: 7.2 10*3/uL (ref 4.0–10.5)
nRBC: 0 % (ref 0.0–0.2)

## 2019-10-31 LAB — BASIC METABOLIC PANEL
Anion gap: 7 (ref 5–15)
BUN: 19 mg/dL (ref 6–20)
CO2: 27 mmol/L (ref 22–32)
Calcium: 9.5 mg/dL (ref 8.9–10.3)
Chloride: 105 mmol/L (ref 98–111)
Creatinine, Ser: 1.28 mg/dL — ABNORMAL HIGH (ref 0.61–1.24)
GFR, Estimated: >60 mL/min (ref >60)
Glucose, Bld: 111 mg/dL — ABNORMAL HIGH (ref 70–99)
Potassium: 3.7 mmol/L (ref 3.5–5.1)
Sodium: 139 mmol/L (ref 135–145)

## 2019-10-31 LAB — TROPONIN I (HIGH SENSITIVITY)
Troponin I (High Sensitivity): 3 ng/L (ref <18)
Troponin I (High Sensitivity): 3 ng/L (ref <18)

## 2019-10-31 MED ORDER — ALUM & MAG HYDROXIDE-SIMETH 200-200-20 MG/5ML PO SUSP
15.0000 mL | Freq: Once | ORAL | Status: AC
  Administered 2019-10-31: 15 mL via ORAL
  Filled 2019-10-31: qty 30

## 2019-10-31 MED ORDER — OMEPRAZOLE 20 MG PO CPDR: 20.0000 mg | DELAYED_RELEASE_CAPSULE | Freq: Every day | ORAL | 1 refills | Status: DC

## 2019-10-31 MED ORDER — LIDOCAINE VISCOUS HCL 2 % MT SOLN
15.0000 mL | Freq: Once | OROMUCOSAL | Status: AC
  Administered 2019-10-31: 15 mL via ORAL
  Filled 2019-10-31: qty 15

## 2019-10-31 NOTE — ED Triage Notes (Signed)
Pt to ED for centralized CP intermittent that started days ago. Denies SHOB, N/V.  Reports hx of acid reflux, has taken tums with no improvement.  States "bad taste in back of mouth" Ambulatory to treatment room, NAD noted, RR even and unlabored  Pt states he has had manic episode this past week, denies SI/HI at  This time

## 2019-10-31 NOTE — ED Notes (Signed)
Pt presents to ED with c/o of epigastric pain. Pt states its feels like a burning sensation. Pt states this has been ongoing for a few days. Pt states a HX of bipolar and has been smoking more cigars than normal due to recent marital issues and states alcohol use recently as well. Pt states he has taken multiple tums with minimal relief. Pt denies any radiation of pain. Pt is A&Ox4. No distress noted. VSS.

## 2019-10-31 NOTE — ED Provider Notes (Signed)
[facility] Emergency Department Provider Note   ____________________________________________   First MD Initiated Contact with Patient 10/31/19 [phone]     (approximate)  I have reviewed the triage vital signs and the nursing notes.   HISTORY  Chief Complaint Chest Pain    HPI Eric Faulkner is a 39 y.o. male with past medical history of bipolar disorder who presents to the ED complaining of chest pain.  Patient reports he has been having intermittent discomfort in the center of his chest for the past 3 to 4 days.  He states the discomfort seems to "move up and down my esophagus" and is associated with a bad taste in the back of his mouth.  He denies any fevers, cough, or shortness of breath.  He endorses a history of acid reflux but has been taking Tums without relief.  He does report having a "manic episode" earlier this week, which has since resolved.  He denies any suicidal ideation, homicidal ideation, auditory or visual hallucinations.        Past Medical History:  Diagnosis Date  . Low back pain     Patient Active Problem List   Diagnosis Date Noted  . Bipolar disorder in remission ([facility]) 08/06/2018  . Seasonal allergies 06/30/2014    History reviewed. No pertinent surgical history.  Prior to Admission medications   Medication Sig Start Date End Date Taking? Authorizing Provider  QUEtiapine (SEROQUEL) 50 MG tablet Take 50 mg by mouth at bedtime. 07/26/19  Yes [provider]  sertraline (ZOLOFT) 25 MG tablet Take 25 mg by mouth daily.  05/08/18  Yes [provider]  omeprazole (PRILOSEC) 20 MG capsule Take 1 capsule (20 mg total) by mouth daily. 10/31/19 10/30/20  Chesley Noon, MD    Allergies Patient has no known allergies.  Family History  Problem Relation Age of Onset  . Cancer Mother 37       breast  . Cancer Maternal Grandfather        throat  . ALS Paternal Uncle     Social History Social History    Tobacco Use  . Smoking status: Light Tobacco Smoker    Types: Cigars  . Smokeless tobacco: Never Used  Substance Use Topics  . Alcohol use: Yes    Alcohol/week: 0.0 standard drinks  . Drug use: No    Review of Systems  Constitutional: No fever/chills Eyes: No visual changes. ENT: No sore throat. Cardiovascular: Positive for chest pain. Respiratory: Denies shortness of breath. Gastrointestinal: No abdominal pain.  No nausea, no vomiting.  No diarrhea.  No constipation. Genitourinary: Negative for dysuria. Musculoskeletal: Negative for back pain. Skin: Negative for rash. Neurological: Negative for headaches, focal weakness or numbness.  ____________________________________________   PHYSICAL EXAM:  VITAL SIGNS: ED Triage Vitals  Enc Vitals Group     BP 10/31/19 0727 (!) 153/91     Pulse Rate 10/31/19 0727 90     Resp 10/31/19 0727 15     Temp 10/31/19 0727 98.3 F (36.8 C)     Temp Source 10/31/19 0727 Oral     SpO2 10/31/19 0727 100 %     Weight 10/31/19 0724 258 lb (117 kg)     Height 10/31/19 0724 6\' 4"  (1.93 m)     Head Circumference --      Peak Flow --      Pain Score 10/31/19 0724 3     Pain Loc --      Pain Edu? --  Excl. in GC? --     Constitutional: Alert and oriented. Eyes: Conjunctivae are normal. Head: Atraumatic. Nose: No congestion/rhinnorhea. Mouth/Throat: Mucous membranes are moist. Neck: Normal ROM Cardiovascular: Normal rate, regular rhythm. Grossly normal heart sounds.  2+ radial pulses bilaterally. Respiratory: Normal respiratory effort.  No retractions. Lungs CTAB. Gastrointestinal: Soft and nontender. No distention. Genitourinary: deferred Musculoskeletal: No lower extremity tenderness nor edema. Neurologic:  Normal speech and language. No gross focal neurologic deficits are appreciated. Skin:  Skin is warm, dry and intact. No rash noted. Psychiatric: Mood and affect are normal. Speech and behavior are  normal.  ____________________________________________   LABS (all labs ordered are listed, but only abnormal results are displayed)  Labs Reviewed  BASIC METABOLIC PANEL - Abnormal; Notable for the following components:      Result Value   Glucose, Bld 111 (*)    Creatinine, Ser 1.28 (*)    All other components within normal limits  CBC WITH DIFFERENTIAL/PLATELET - Abnormal; Notable for the following components:   Hemoglobin 12.9 (*)    HCT 37.0 (*)    MCV 74.9 (*)    All other components within normal limits  TROPONIN I (HIGH SENSITIVITY)  TROPONIN I (HIGH SENSITIVITY)   ____________________________________________  EKG  ED ECG REPORT I, Chesley Noon, the attending physician, personally viewed and interpreted this ECG.   Date: 10/31/2019  EKG Time: 7:28  Rate: 91  Rhythm: normal sinus rhythm  Axis: Normal  Intervals:none  ST&T Change: None   PROCEDURES  Procedure(s) performed (including Critical Care):  Procedures   ____________________________________________   INITIAL IMPRESSION / ASSESSMENT AND PLAN / ED COURSE       39 year old male with past medical history of bipolar disorder who presents to the ED complaining of discomfort in his chest that seems to move up and down his throat with a bad taste in his mouth.  EKG shows no evidence of arrhythmia or ischemia and I doubt ACS given his atypical symptoms, which sound more consistent with GERD.  We will screen labs including troponin, also check chest x-ray.  We will treat with a GI cocktail and reassess.  Symptoms do not sound consistent with PE or dissection.  2 sets of troponin are negative and I doubt ACS in this patient.  Symptoms are improved following GI cocktail and he seems most likely to be having GERD with esophagitis.  We will start patient on omeprazole and he was counseled to follow-up with his PCP, otherwise return to the ED for new worsening symptoms.  Patient agrees with plan.      ____________________________________________   FINAL CLINICAL IMPRESSION(S) / ED DIAGNOSES  Final diagnoses:  Nonspecific chest pain  Gastroesophageal reflux disease with esophagitis without hemorrhage     ED Discharge Orders         Ordered    omeprazole (PRILOSEC) 20 MG capsule  Daily        10/31/19 1158           Note:  This document was prepared using Dragon voice recognition software and may include unintentional dictation errors.   Chesley Noon, MD 10/31/19 1159

## 2019-10-31 NOTE — ED Notes (Signed)
Pt resting quietly no distress noted. Pt states mild relief of epigastric pain after medication administration. No distress noted. No further needs at this time. Call bell in reach.

## 2019-11-02 ENCOUNTER — Ambulatory Visit (INDEPENDENT_AMBULATORY_CARE_PROVIDER_SITE_OTHER): Payer: Federal, State, Local not specified - PPO | Admitting: Internal Medicine

## 2019-11-02 ENCOUNTER — Encounter: Payer: Self-pay | Admitting: Internal Medicine

## 2019-11-02 ENCOUNTER — Other Ambulatory Visit: Payer: Self-pay

## 2019-11-02 VITALS — BP 120/80 | HR 112 | Temp 98.5°F | Wt 261.0 lb

## 2019-11-02 DIAGNOSIS — T171XXA Foreign body in nostril, initial encounter: Secondary | ICD-10-CM

## 2019-11-02 DIAGNOSIS — K219 Gastro-esophageal reflux disease without esophagitis: Secondary | ICD-10-CM

## 2019-11-02 NOTE — Progress Notes (Signed)
Subjective:    Patient ID: Eric Faulkner, male    DOB: 29-Sep-1980, 39 y.o.   MRN: [id number]  HPI  Pt presents to the clinic today with c/o foreign body in nose. He reports he thinks he has a piece of granola stuck up his nose. He has been trying to blow his nose unsuccessfully. He feels it in the top of his nasal passage.  Of note, he was just in the ER 10/31/19 with c/o chest pain that seemed to go up and down and cause a sore throat. Chest xray was normal. ECG was normal. Labs showed slight increase in creatinine, slight anemia. He was treated with a GI cocktail, started on Omeprazole and advised to follow up with PCP. Since discharge, he denies complaints of sore throat or chest pain.  Review of Systems      Past Medical History:  Diagnosis Date  . Low back pain     Current Outpatient Medications  Medication Sig Dispense Refill  . omeprazole (PRILOSEC) 20 MG capsule Take 1 capsule (20 mg total) by mouth daily. 30 capsule 1  . QUEtiapine (SEROQUEL) 50 MG tablet Take 50 mg by mouth at bedtime.    . sertraline (ZOLOFT) 25 MG tablet Take 25 mg by mouth daily.      No current facility-administered medications for this visit.    No Known Allergies  Family History  Problem Relation Age of Onset  . Cancer Mother 42       breast  . Cancer Maternal Grandfather        throat  . ALS Paternal Uncle     Social History   Socioeconomic History  . Marital status: Married    Spouse name: Not on file  . Number of children: Not on file  . Years of education: Not on file  . Highest education level: Not on file  Occupational History  . Not on file  Tobacco Use  . Smoking status: Light Tobacco Smoker    Types: Cigars  . Smokeless tobacco: Never Used  Substance and Sexual Activity  . Alcohol use: Yes    Alcohol/week: 0.0 standard drinks  . Drug use: No  . Sexual activity: Yes  Other Topics Concern  . Not on file  Social History Narrative   Works in Research officer, political party.    Married, 3 kids  ( 18 months, 80yrs and 11 yrs)      Social Determinants of Corporate investment banker Strain:   . Difficulty of Paying Living Expenses: Not on file  Food Insecurity:   . Worried About Programme researcher, broadcasting/film/video in the Last Year: Not on file  . Ran Out of Food in the Last Year: Not on file  Transportation Needs:   . Lack of Transportation (Medical): Not on file  . Lack of Transportation (Non-Medical): Not on file  Physical Activity:   . Days of Exercise per Week: Not on file  . Minutes of Exercise per Session: Not on file  Stress:   . Feeling of Stress : Not on file  Social Connections:   . Frequency of Communication with Friends and Family: Not on file  . Frequency of Social Gatherings with Friends and Family: Not on file  . Attends Religious Services: Not on file  . Active Member of Clubs or Organizations: Not on file  . Attends Banker Meetings: Not on file  . Marital Status: Not on file  Intimate Partner Violence:   . Fear  of Current or Ex-Partner: Not on file  . Emotionally Abused: Not on file  . Physically Abused: Not on file  . Sexually Abused: Not on file     Constitutional: Denies fever, malaise, fatigue, headache or abrupt weight changes.  HEENT: Pt reports foreign body in nose. Denies eye pain, eye redness, ear pain, ringing in the ears, wax buildup, runny nose, nasal congestion, bloody nose, or sore throat. Respiratory: Denies difficulty breathing, shortness of breath, cough or sputum production.   Cardiovascular: Denies chest pain, chest tightness, palpitations or swelling in the hands or feet.  Gastrointestinal: Denies abdominal pain, bloating, constipation, diarrhea or blood in the stool.   No other specific complaints in a complete review of systems (except as listed in HPI above).  Objective:   Physical Exam BP 120/80   Pulse (!) 112   Temp 98.5 F (36.9 C) (Temporal)   Wt 261 lb (118.4 kg)   SpO2 98%   BMI 31.77 kg/m   Wt  Readings from Last 3 Encounters:  10/31/19 258 lb (117 kg)  08/17/19 266 lb 8 oz (120.9 kg)  08/06/18 299 lb 8 oz (135.9 kg)    General: Appears his stated age, well developed, well nourished in NAD. HEENT: Head: normal shape and size;  Nose: mucosa boggy and moist; turbinates swollen, unable to visualize past them. Throat/Mouth: Teeth present, mucosa pink and moist, no exudate, lesions or ulcerations noted.  Neck:  No adenopathy noted.  Cardiovascular: Normal rate and rhythm.  Pulmonary/Chest: Normal effort and positive vesicular breath sounds. No respiratory distress. No wheezes, rales or ronchi noted.  Abdomen: Soft and nontender.  Neurological: Alert and oriented.    BMET    Component Value Date/Time   NA 139 10/31/2019 0750   K 3.7 10/31/2019 0750   CL 105 10/31/2019 0750   CO2 27 10/31/2019 0750   GLUCOSE 111 (H) 10/31/2019 0750   BUN 19 10/31/2019 0750   CREATININE 1.28 (H) 10/31/2019 0750   CALCIUM 9.5 10/31/2019 0750   GFRNONAA >60 10/31/2019 0750    Lipid Panel     Component Value Date/Time   CHOL 195 08/06/2019 0931   TRIG 68.0 08/06/2019 0931   HDL 38.40 (L) 08/06/2019 0931   CHOLHDL 5 08/06/2019 0931   VLDL 13.6 08/06/2019 0931   LDLCALC 143 (H) 08/06/2019 0931    CBC    Component Value Date/Time   WBC 7.2 10/31/2019 0750   RBC 4.94 10/31/2019 0750   HGB 12.9 (L) 10/31/2019 0750   HCT 37.0 (L) 10/31/2019 0750   PLT 244 10/31/2019 0750   MCV 74.9 (L) 10/31/2019 0750   MCH 26.1 10/31/2019 0750   MCHC 34.9 10/31/2019 0750   RDW 14.5 10/31/2019 0750   LYMPHSABS 2.4 10/31/2019 0750   MONOABS 0.9 10/31/2019 0750   EOSABS 0.2 10/31/2019 0750   BASOSABS 0.0 10/31/2019 0750    Hgb A1C No results found for: HGBA1C          Assessment & Plan:   Foreign Body in Nose:  Unable to visualize Urgent referral to ENT for further evaluation  ER Follow Up for Chest Pain:  ER notes, labs and imaging reviewed Likely reflux Continue  Omeprazole  Follow up with PCP if symptoms persist or worsen Nicki Reaper, NP This visit occurred during the SARS-CoV-2 public health emergency.  Safety protocols were in place, including screening questions prior to the visit, additional usage of staff PPE, and extensive cleaning of exam room while observing appropriate contact time  as indicated for disinfecting solutions.

## 2019-11-03 DIAGNOSIS — J342 Deviated nasal septum: Secondary | ICD-10-CM | POA: Diagnosis not present

## 2019-11-03 DIAGNOSIS — J3489 Other specified disorders of nose and nasal sinuses: Secondary | ICD-10-CM | POA: Diagnosis not present

## 2019-11-09 ENCOUNTER — Other Ambulatory Visit: Payer: Self-pay

## 2019-11-09 ENCOUNTER — Ambulatory Visit: Payer: Federal, State, Local not specified - PPO | Admitting: Family Medicine

## 2019-11-11 DIAGNOSIS — F319 Bipolar disorder, unspecified: Secondary | ICD-10-CM | POA: Diagnosis not present

## 2019-11-11 DIAGNOSIS — F411 Generalized anxiety disorder: Secondary | ICD-10-CM | POA: Diagnosis not present

## 2019-11-12 ENCOUNTER — Encounter: Payer: Self-pay | Admitting: Family Medicine

## 2019-11-12 ENCOUNTER — Other Ambulatory Visit: Payer: Self-pay

## 2019-11-12 ENCOUNTER — Ambulatory Visit (INDEPENDENT_AMBULATORY_CARE_PROVIDER_SITE_OTHER): Payer: Federal, State, Local not specified - PPO | Admitting: Family Medicine

## 2019-11-12 VITALS — BP 110/70 | HR 84 | Temp 98.1°F | Ht 75.5 in | Wt 261.5 lb

## 2019-11-12 DIAGNOSIS — F310 Bipolar disorder, current episode hypomanic: Secondary | ICD-10-CM

## 2019-11-12 DIAGNOSIS — L723 Sebaceous cyst: Secondary | ICD-10-CM | POA: Diagnosis not present

## 2019-11-12 DIAGNOSIS — K219 Gastro-esophageal reflux disease without esophagitis: Secondary | ICD-10-CM | POA: Diagnosis not present

## 2019-11-12 NOTE — Progress Notes (Signed)
Chief Complaint  Patient presents with  . Lump on Sternum    History of Present Illness: HPI   39 year old male patient presents with new onset  lump on sternum.  he states he is excessively anxious given his mental health history and is not sure he should be worried about the lump on his sternum. Has been having issues with acid reflux... seen on  ER on 10/2019. Started  Omeprazole 20 mg daily... has improved some.  Has noted in last 1-2 weeks lump in center of stenrum mid way down. Not itchy, not red.  No associated rash.    He is in manic attack.. not sleeping well... Has appt with new psychiatrist upcoming. Wants to get back on lithium. Denies SI.   reviewed ED visit and labs from 10/31/2019.. for chest pain.  This visit occurred during the SARS-CoV-2 public health emergency.  Safety protocols were in place, including screening questions prior to the visit, additional usage of staff PPE, and extensive cleaning of exam room while observing appropriate contact time as indicated for disinfecting solutions.   COVID 19 screen:  No recent travel or known exposure to COVID19 The patient denies respiratory symptoms of COVID 19 at this time. The importance of social distancing was discussed today.     Review of Systems  Constitutional: Negative for chills and fever.  HENT: Negative for congestion and ear pain.   Eyes: Negative for pain and redness.  Respiratory: Negative for cough and shortness of breath.   Cardiovascular: Negative for chest pain, palpitations and leg swelling.  Gastrointestinal: Negative for abdominal pain, blood in stool, constipation, diarrhea, nausea and vomiting.  Genitourinary: Negative for dysuria.  Musculoskeletal: Negative for falls and myalgias.  Skin: Negative for rash.  Neurological: Negative for dizziness.  Psychiatric/Behavioral: Negative for depression and suicidal ideas. The patient is nervous/anxious and has insomnia.       Past Medical  History:  Diagnosis Date  . Low back pain     reports that he has been smoking cigars. He has never used smokeless tobacco. He reports current alcohol use. He reports that he does not use drugs.   Current Outpatient Medications:  .  omeprazole (PRILOSEC) 20 MG capsule, Take 1 capsule (20 mg total) by mouth daily., Disp: 30 capsule, Rfl: 1 .  QUEtiapine (SEROQUEL) 50 MG tablet, Take 50 mg by mouth at bedtime., Disp: , Rfl:  .  sertraline (ZOLOFT) 25 MG tablet, Take 25 mg by mouth daily. , Disp: , Rfl:    Observations/Objective: Blood pressure 110/70, pulse 84, temperature 98.1 F (36.7 C), temperature source Temporal, height 6' 3.5" (1.918 m), weight 261 lb 8 oz (118.6 kg), SpO2 95 %.  Physical Exam Constitutional:      General: Vital signs are normal.     Appearance: He is well-developed and well-nourished.  HENT:     Head: Normocephalic.     Right Ear: Hearing normal.     Left Ear: Hearing normal.     Nose: Nose normal.     Mouth/Throat:     Mouth: Oropharynx is clear and moist and mucous membranes are normal.  Neck:     Thyroid: No thyroid mass or thyromegaly.     Vascular: No carotid bruit.     Trachea: Trachea normal.  Cardiovascular:     Rate and Rhythm: Normal rate and regular rhythm.     Pulses: Normal pulses.     Heart sounds: Heart sounds not distant. No murmur heard. No friction  rub. No gallop.      Comments: No peripheral edema Pulmonary:     Effort: Pulmonary effort is normal. No respiratory distress.     Breath sounds: Normal breath sounds.  Skin:    General: Skin is warm, dry and intact.     Findings: No rash.     Comments: Small pea size sebaceous cyst on chest, no associated redness,heat swelling or pain.  Psychiatric:        Mood and Affect: Mood and affect normal.        Speech: Speech normal.        Behavior: Behavior normal.        Thought Content: Thought content normal.      Assessment and Plan    Problem List Items Addressed This Visit     Bipolar affective disorder (HCC)    Pt with good insight. Currently hypomanic, safe to self and others. HAs good psychiatric follow up and plans to restart lithium.      Gastroesophageal reflux disease without esophagitis    Increase to omeprazole 40 mg daily x 2 week then reduce down to 20 mg daily.       Sebaceous cyst - Primary    Pt reassured. Not currently infected. Treat with warm compresses.           Kerby Nora, MD

## 2019-11-12 NOTE — Patient Instructions (Signed)
Increase to omeprazole 40 mg daily x 2 week then reduce down to 20 mg daily.  Can do warm compresses and topical antibiotic ointment.

## 2019-11-17 DIAGNOSIS — Z713 Dietary counseling and surveillance: Secondary | ICD-10-CM | POA: Diagnosis not present

## 2019-11-22 DIAGNOSIS — F3175 Bipolar disorder, in partial remission, most recent episode depressed: Secondary | ICD-10-CM | POA: Diagnosis not present

## 2019-12-14 DIAGNOSIS — F411 Generalized anxiety disorder: Secondary | ICD-10-CM | POA: Diagnosis not present

## 2019-12-14 DIAGNOSIS — F319 Bipolar disorder, unspecified: Secondary | ICD-10-CM | POA: Diagnosis not present

## 2019-12-27 DIAGNOSIS — F319 Bipolar disorder, unspecified: Secondary | ICD-10-CM | POA: Diagnosis not present

## 2019-12-31 DIAGNOSIS — F4320 Adjustment disorder, unspecified: Secondary | ICD-10-CM | POA: Diagnosis not present

## 2020-01-04 DIAGNOSIS — F411 Generalized anxiety disorder: Secondary | ICD-10-CM | POA: Diagnosis not present

## 2020-01-04 DIAGNOSIS — F319 Bipolar disorder, unspecified: Secondary | ICD-10-CM | POA: Diagnosis not present

## 2020-01-04 DIAGNOSIS — Z1152 Encounter for screening for COVID-19: Secondary | ICD-10-CM | POA: Diagnosis not present

## 2020-01-04 DIAGNOSIS — Z7189 Other specified counseling: Secondary | ICD-10-CM | POA: Diagnosis not present

## 2020-01-12 DIAGNOSIS — Z79899 Other long term (current) drug therapy: Secondary | ICD-10-CM | POA: Diagnosis not present

## 2020-01-12 NOTE — Assessment & Plan Note (Signed)
Increase to omeprazole 40 mg daily x 2 week then reduce down to 20 mg daily.

## 2020-01-12 NOTE — Assessment & Plan Note (Signed)
Pt with good insight. Currently hypomanic, safe to self and others. HAs good psychiatric follow up and plans to restart lithium.

## 2020-01-12 NOTE — Assessment & Plan Note (Signed)
Pt reassured. Not currently infected. Treat with warm compresses.

## 2020-01-17 DIAGNOSIS — F3175 Bipolar disorder, in partial remission, most recent episode depressed: Secondary | ICD-10-CM | POA: Diagnosis not present

## 2020-01-21 DIAGNOSIS — F438 Other reactions to severe stress: Secondary | ICD-10-CM | POA: Diagnosis not present

## 2020-01-21 DIAGNOSIS — F319 Bipolar disorder, unspecified: Secondary | ICD-10-CM | POA: Diagnosis not present

## 2020-01-21 DIAGNOSIS — F900 Attention-deficit hyperactivity disorder, predominantly inattentive type: Secondary | ICD-10-CM | POA: Diagnosis not present

## 2020-01-21 DIAGNOSIS — F411 Generalized anxiety disorder: Secondary | ICD-10-CM | POA: Diagnosis not present

## 2020-01-25 DIAGNOSIS — F900 Attention-deficit hyperactivity disorder, predominantly inattentive type: Secondary | ICD-10-CM | POA: Diagnosis not present

## 2020-01-25 DIAGNOSIS — F411 Generalized anxiety disorder: Secondary | ICD-10-CM | POA: Diagnosis not present

## 2020-01-25 DIAGNOSIS — F438 Other reactions to severe stress: Secondary | ICD-10-CM | POA: Diagnosis not present

## 2020-01-25 DIAGNOSIS — F319 Bipolar disorder, unspecified: Secondary | ICD-10-CM | POA: Diagnosis not present

## 2020-02-01 ENCOUNTER — Ambulatory Visit: Payer: Federal, State, Local not specified - PPO | Admitting: Family Medicine

## 2020-02-03 ENCOUNTER — Ambulatory Visit: Payer: Federal, State, Local not specified - PPO | Admitting: Family Medicine

## 2020-02-08 DIAGNOSIS — F3189 Other bipolar disorder: Secondary | ICD-10-CM | POA: Diagnosis not present

## 2020-02-22 DIAGNOSIS — R635 Abnormal weight gain: Secondary | ICD-10-CM | POA: Diagnosis not present

## 2020-02-28 DIAGNOSIS — F3175 Bipolar disorder, in partial remission, most recent episode depressed: Secondary | ICD-10-CM | POA: Diagnosis not present

## 2020-03-06 DIAGNOSIS — F3189 Other bipolar disorder: Secondary | ICD-10-CM | POA: Diagnosis not present

## 2020-03-21 DIAGNOSIS — Z79899 Other long term (current) drug therapy: Secondary | ICD-10-CM | POA: Diagnosis not present

## 2020-03-27 DIAGNOSIS — F3175 Bipolar disorder, in partial remission, most recent episode depressed: Secondary | ICD-10-CM | POA: Diagnosis not present

## 2020-04-03 DIAGNOSIS — F3189 Other bipolar disorder: Secondary | ICD-10-CM | POA: Diagnosis not present

## 2020-04-25 DIAGNOSIS — F3175 Bipolar disorder, in partial remission, most recent episode depressed: Secondary | ICD-10-CM | POA: Diagnosis not present

## 2020-05-05 DIAGNOSIS — R635 Abnormal weight gain: Secondary | ICD-10-CM | POA: Diagnosis not present

## 2020-05-23 DIAGNOSIS — F3175 Bipolar disorder, in partial remission, most recent episode depressed: Secondary | ICD-10-CM | POA: Diagnosis not present

## 2020-06-08 ENCOUNTER — Ambulatory Visit: Payer: Federal, State, Local not specified - PPO | Admitting: Family Medicine

## 2020-06-08 ENCOUNTER — Encounter: Payer: Self-pay | Admitting: Family Medicine

## 2020-06-08 ENCOUNTER — Other Ambulatory Visit: Payer: Self-pay

## 2020-06-08 VITALS — BP 106/68 | HR 86 | Temp 97.9°F | Ht 75.5 in | Wt 252.0 lb

## 2020-06-08 DIAGNOSIS — L02412 Cutaneous abscess of left axilla: Secondary | ICD-10-CM | POA: Diagnosis not present

## 2020-06-08 DIAGNOSIS — L723 Sebaceous cyst: Secondary | ICD-10-CM

## 2020-06-08 MED ORDER — CEPHALEXIN 500 MG PO CAPS: 500.0000 mg | ORAL_CAPSULE | Freq: Three times a day (TID) | ORAL | 0 refills | Status: DC

## 2020-06-08 NOTE — Progress Notes (Signed)
Patient ID: Eric Faulkner, male    DOB: 1980/09/17, 40 y.o.   MRN: [id number]  This visit was conducted in person.  BP 106/68   Pulse 86   Temp 97.9 F (36.6 C) (Temporal)   Ht 6' 3.5" (1.918 m)   Wt 252 lb (114.3 kg)   SpO2 97%   BMI 31.08 kg/m    CC:  Chief Complaint  Patient presents with   Adenopathy    Subjective:   HPI: Eric Faulkner is a 40 y.o. male presenting on 06/08/2020 for Adenopathy   He has noted intermittent swelling in both armpits in last 2 months. Areas in axillae are sore, has seen some discharge in left axillae.. brownish discharge.  No rash. Does have new deoderant.   Started on lithium and started  keto in 02/2020     Relevant past medical, surgical, family and social history reviewed and updated as indicated. Interim medical history since our last visit reviewed. Allergies and medications reviewed and updated. Outpatient Medications Prior to Visit  Medication Sig Dispense Refill   omeprazole (PRILOSEC) 20 MG capsule Take 1 capsule (20 mg total) by mouth daily. 30 capsule 1   QUEtiapine (SEROQUEL) 50 MG tablet Take 50 mg by mouth at bedtime.     sertraline (ZOLOFT) 25 MG tablet Take 25 mg by mouth daily.      No facility-administered medications prior to visit.     Per HPI unless specifically indicated in ROS section below Review of Systems  Constitutional:  Negative for fatigue and fever.  HENT:  Negative for ear pain.   Eyes:  Negative for pain.  Respiratory:  Negative for cough and shortness of breath.   Cardiovascular:  Negative for chest pain, palpitations and leg swelling.  Gastrointestinal:  Negative for abdominal pain.  Genitourinary:  Negative for dysuria.  Musculoskeletal:  Negative for arthralgias.  Neurological:  Negative for syncope, light-headedness and headaches.  Psychiatric/Behavioral:  Negative for dysphoric mood.   Objective:  BP 106/68   Pulse 86   Temp 97.9 F (36.6 C) (Temporal)   Ht 6' 3.5" (1.918 m)    Wt 252 lb (114.3 kg)   SpO2 97%   BMI 31.08 kg/m   Wt Readings from Last 3 Encounters:  06/08/20 252 lb (114.3 kg)  11/12/19 261 lb 8 oz (118.6 kg)  11/02/19 261 lb (118.4 kg)      Physical Exam Constitutional:      Appearance: He is well-developed.  HENT:     Head: Normocephalic.     Right Ear: Hearing normal.     Left Ear: Hearing normal.     Nose: Nose normal.  Neck:     Thyroid: No thyroid mass or thyromegaly.     Vascular: No carotid bruit.     Trachea: Trachea normal.  Cardiovascular:     Rate and Rhythm: Normal rate and regular rhythm.     Pulses: Normal pulses.     Heart sounds: Heart sounds not distant. No murmur heard.   No friction rub. No gallop.     Comments: No peripheral edema Pulmonary:     Effort: Pulmonary effort is normal. No respiratory distress.     Breath sounds: Normal breath sounds.  Skin:    General: Skin is warm and dry.     Findings: No rash.     Comments: Erythema and fluctuance in  left axillae, with active discharge, slight erythema and nodularity in right armpit.  Psychiatric:  Speech: Speech normal.        Behavior: Behavior normal.        Thought Content: Thought content normal.      Results for orders placed or performed during the hospital encounter of 10/31/19  Basic metabolic panel  Result Value Ref Range   Sodium 139 135 - 145 mmol/L   Potassium 3.7 3.5 - 5.1 mmol/L   Chloride 105 98 - 111 mmol/L   CO2 27 22 - 32 mmol/L   Glucose, Bld 111 (H) 70 - 99 mg/dL   BUN 19 6 - 20 mg/dL   Creatinine, Ser 9.76 (H) 0.61 - 1.24 mg/dL   Calcium 9.5 8.9 - 73.4 mg/dL   GFR, Estimated >19 >37 mL/min   Anion gap 7 5 - 15  CBC with Differential  Result Value Ref Range   WBC 7.2 4.0 - 10.5 K/uL   RBC 4.94 4.22 - 5.81 MIL/uL   Hemoglobin 12.9 (L) 13.0 - 17.0 g/dL   HCT 90.2 (L) 40.9 - 73.5 %   MCV 74.9 (L) 80.0 - 100.0 fL   MCH 26.1 26.0 - 34.0 pg   MCHC 34.9 30.0 - 36.0 g/dL   RDW 32.9 92.4 - 26.8 %   Platelets 244 150 - 400  K/uL   nRBC 0.0 0.0 - 0.2 %   Neutrophils Relative % 51 %   Neutro Abs 3.6 1.7 - 7.7 K/uL   Lymphocytes Relative 33 %   Lymphs Abs 2.4 0.7 - 4.0 K/uL   Monocytes Relative 12 %   Monocytes Absolute 0.9 0.1 - 1.0 K/uL   Eosinophils Relative 3 %   Eosinophils Absolute 0.2 0.0 - 0.5 K/uL   Basophils Relative 1 %   Basophils Absolute 0.0 0.0 - 0.1 K/uL   Immature Granulocytes 0 %   Abs Immature Granulocytes 0.01 0.00 - 0.07 K/uL  Troponin I (High Sensitivity)  Result Value Ref Range   Troponin I (High Sensitivity) 3 <18 ng/L  Troponin I (High Sensitivity)  Result Value Ref Range   Troponin I (High Sensitivity) 3 <18 ng/L    This visit occurred during the SARS-CoV-2 public health emergency.  Safety protocols were in place, including screening questions prior to the visit, additional usage of staff PPE, and extensive cleaning of exam room while observing appropriate contact time as indicated for disinfecting solutions.   COVID 19 screen:  No recent travel or known exposure to COVID19 The patient denies respiratory symptoms of COVID 19 at this time. The importance of social distancing was discussed today.   Assessment and Plan Problem List Items Addressed This Visit     Abscess of axilla, left - Primary    Start warm compresses on left armpit 3 times daily.  Start antibiotics x 7 days.       Relevant Orders   WOUND CULTURE (Completed)   Sebaceous cyst of axilla   Relevant Orders   WOUND CULTURE (Completed)   Meds ordered this encounter  Medications   DISCONTD: cephALEXin (KEFLEX) 500 MG capsule    Sig: Take 1 capsule (500 mg total) by mouth 3 (three) times daily.    Dispense:  21 capsule    Refill:  0     Kerby Nora, MD

## 2020-06-08 NOTE — Patient Instructions (Signed)
Start warm compresses on left armpit 3 times daily.  Start antibiotics x 7 days.  Skin Abscess or infected cyst  A skin abscess is an infected area on or under your skin that contains a collection of pus and other material. An abscess may also be called a furuncle, carbuncle, or boil. An abscess can occur in or on almost any part of your body. Some abscesses break open (rupture) on their own. Most continue to get worse unless they are treated. The infection can spread deeper into the body and eventually into your blood, which can make you feel ill. Treatment usually involves draining the abscess. What are the causes? An abscess occurs when germs, like bacteria, pass through your skin and cause an infection. This may be caused by:  A scrape or cut on your skin.  A puncture wound through your skin, including a needle injection or insect bite.  Blocked oil or sweat glands.  Blocked and infected hair follicles.  A cyst that forms beneath your skin (sebaceous cyst) and becomes infected. What increases the risk? This condition is more likely to develop in people who:  Have a weak body defense system (immune system).  Have diabetes.  Have dry and irritated skin.  Get frequent injections or use illegal IV drugs.  Have a foreign body in a wound, such as a splinter.  Have problems with their lymph system or veins. What are the signs or symptoms? Symptoms of this condition include:  A painful, firm bump under the skin.  A bump with pus at the top. This may break through the skin and drain. Other symptoms include:  Redness surrounding the abscess site.  Warmth.  Swelling of the lymph nodes (glands) near the abscess.  Tenderness.  A sore on the skin. How is this diagnosed? This condition may be diagnosed based on:  A physical exam.  Your medical history.  A sample of pus. This may be used to find out what is causing the infection.  Blood tests.  Imaging tests, such as  an ultrasound, CT scan, or MRI. How is this treated? A small abscess that drains on its own may not need treatment. Treatment for larger abscesses may include:  Moist heat or heat pack applied to the area several times a day.  A procedure to drain the abscess (incision and drainage).  Antibiotic medicines. For a severe abscess, you may first get antibiotics through an IV and then change to antibiotics by mouth. Follow these instructions at home: Medicines  Take over-the-counter and prescription medicines only as told by your health care provider.  If you were prescribed an antibiotic medicine, take it as told by your health care provider. Do not stop taking the antibiotic even if you start to feel better.   Abscess care  If you have an abscess that has not drained, apply heat to the affected area. Use the heat source that your health care provider recommends, such as a moist heat pack or a heating pad. ? Place a towel between your skin and the heat source. ? Leave the heat on for 20-30 minutes. ? Remove the heat if your skin turns bright red. This is especially important if you are unable to feel pain, heat, or cold. You may have a greater risk of getting burned.  Follow instructions from your health care provider about how to take care of your abscess. Make sure you: ? Cover the abscess with a bandage (dressing). ? Change your dressing or gauze  as told by your health care provider. ? Wash your hands with soap and water before you change the dressing or gauze. If soap and water are not available, use hand sanitizer.  Check your abscess every day for signs of a worsening infection. Check for: ? More redness, swelling, or pain. ? More fluid or blood. ? Warmth. ? More pus or a bad smell.   General instructions  To avoid spreading the infection: ? Do not share personal care items, towels, or hot tubs with others. ? Avoid making skin contact with other people.  Keep all follow-up  visits as told by your health care provider. This is important. Contact a health care provider if you have:  More redness, swelling, or pain around your abscess.  More fluid or blood coming from your abscess.  Warm skin around your abscess.  More pus or a bad smell coming from your abscess.  A fever.  Muscle aches.  Chills or a general ill feeling. Get help right away if you:  Have severe pain.  See red streaks on your skin spreading away from the abscess. Summary  A skin abscess is an infected area on or under your skin that contains a collection of pus and other material.  A small abscess that drains on its own may not need treatment.  Treatment for larger abscesses may include having a procedure to drain the abscess and taking an antibiotic. This information is not intended to replace advice given to you by your health care provider. Make sure you discuss any questions you have with your health care provider. Document Revised: 04/23/2018 Document Reviewed: 02/13/2017 Elsevier Patient Education  2021 [employer].

## 2020-06-11 LAB — WOUND CULTURE
MICRO NUMBER:: 11938730
SPECIMEN QUALITY:: ADEQUATE

## 2020-06-20 DIAGNOSIS — F3175 Bipolar disorder, in partial remission, most recent episode depressed: Secondary | ICD-10-CM | POA: Diagnosis not present

## 2020-06-27 DIAGNOSIS — F3189 Other bipolar disorder: Secondary | ICD-10-CM | POA: Diagnosis not present

## 2020-07-14 ENCOUNTER — Encounter: Payer: Self-pay | Admitting: Family Medicine

## 2020-07-14 ENCOUNTER — Other Ambulatory Visit: Payer: Self-pay

## 2020-07-14 ENCOUNTER — Ambulatory Visit: Payer: Federal, State, Local not specified - PPO | Admitting: Family Medicine

## 2020-07-14 VITALS — BP 89/57 | HR 73 | Temp 98.1°F | Ht 75.5 in | Wt 255.8 lb

## 2020-07-14 DIAGNOSIS — I959 Hypotension, unspecified: Secondary | ICD-10-CM | POA: Diagnosis not present

## 2020-07-14 DIAGNOSIS — F310 Bipolar disorder, current episode hypomanic: Secondary | ICD-10-CM

## 2020-07-14 DIAGNOSIS — L732 Hidradenitis suppurativa: Secondary | ICD-10-CM | POA: Insufficient documentation

## 2020-07-14 DIAGNOSIS — R635 Abnormal weight gain: Secondary | ICD-10-CM | POA: Diagnosis not present

## 2020-07-14 DIAGNOSIS — Z79899 Other long term (current) drug therapy: Secondary | ICD-10-CM

## 2020-07-14 DIAGNOSIS — Z6833 Body mass index (BMI) 33.0-33.9, adult: Secondary | ICD-10-CM | POA: Diagnosis not present

## 2020-07-14 LAB — CBC WITH DIFFERENTIAL/PLATELET
Basophils Absolute: 0.1 10*3/uL (ref 0.0–0.1)
Basophils Relative: 1 % (ref 0.0–3.0)
Eosinophils Absolute: 0.5 10*3/uL (ref 0.0–0.7)
Eosinophils Relative: 8.1 % — ABNORMAL HIGH (ref 0.0–5.0)
HCT: 42.8 % (ref 39.0–52.0)
Hemoglobin: 14.2 g/dL (ref 13.0–17.0)
Lymphocytes Relative: 28.9 % (ref 12.0–46.0)
Lymphs Abs: 1.8 10*3/uL (ref 0.7–4.0)
MCHC: 33.2 g/dL (ref 30.0–36.0)
MCV: 81.6 fl (ref 78.0–100.0)
Monocytes Absolute: 0.5 10*3/uL (ref 0.1–1.0)
Monocytes Relative: 7.5 % (ref 3.0–12.0)
Neutro Abs: 3.3 10*3/uL (ref 1.4–7.7)
Neutrophils Relative %: 54.5 % (ref 43.0–77.0)
Platelets: 233 10*3/uL (ref 150.0–400.0)
RBC: 5.24 Mil/uL (ref 4.22–5.81)
RDW: 15.4 % (ref 11.5–15.5)
WBC: 6.1 10*3/uL (ref 4.0–10.5)

## 2020-07-14 LAB — COMPREHENSIVE METABOLIC PANEL
ALT: 14 U/L (ref 0–53)
AST: 12 U/L (ref 0–37)
Albumin: 4.3 g/dL (ref 3.5–5.2)
Alkaline Phosphatase: 57 U/L (ref 39–117)
BUN: 13 mg/dL (ref 6–23)
CO2: 29 mEq/L (ref 19–32)
Calcium: 9.4 mg/dL (ref 8.4–10.5)
Chloride: 108 mEq/L (ref 96–112)
Creatinine, Ser: 1.25 mg/dL (ref 0.40–1.50)
GFR: 71.47 mL/min (ref >60.00)
Glucose, Bld: 77 mg/dL (ref 70–99)
Potassium: 4.2 mEq/L (ref 3.5–5.1)
Sodium: 142 mEq/L (ref 135–145)
Total Bilirubin: 0.9 mg/dL (ref 0.2–1.2)
Total Protein: 7.1 g/dL (ref 6.0–8.3)

## 2020-07-14 LAB — TSH: TSH: 1.59 u[IU]/mL (ref 0.35–5.50)

## 2020-07-14 MED ORDER — DOXYCYCLINE HYCLATE 100 MG PO TABS: 100.0000 mg | ORAL_TABLET | Freq: Every day | ORAL | 2 refills | Status: DC

## 2020-07-14 NOTE — Patient Instructions (Addendum)
Use Hibiclens in armpits to clean.  Start longterm  antibiotics x 3 months.  Increase water intake.   Please stop at the lab to have labs drawn.  We will set up referral to dermatology.   Look into changing lithium  given possible SE.

## 2020-07-14 NOTE — Progress Notes (Signed)
Patient ID: Eric Faulkner, male    DOB: June 25, 1980, 40 y.o.   MRN: [id number]  This visit was conducted in person.  BP (!) 89/57   Pulse 73   Temp 98.1 F (36.7 C) (Temporal)   Ht 6' 3.5" (1.918 m)   Wt 255 lb 12 oz (116 kg)   SpO2 98%   BMI 31.54 kg/m    CC: Chief Complaint  Patient presents with   Follow-up    Left Axilla Abscess    Subjective:   HPI: Eric Faulkner is a 40 y.o. male presenting on 07/14/2020 for Follow-up (Left Axilla Abscess)    He notes fluctuation of cyst in bilateral armpits... especially  in left axillae. Cmes and goes.  He feels that antibiotics helped a lot.  He is on lithium can cause acne issues.    He is currently using natural deoderant and soap.    BP low but  on lithium and seroquel. BP Readings from Last 3 Encounters:  07/14/20 (!) 89/57  06/08/20 106/68  11/12/19 110/70     Relevant past medical, surgical, family and social history reviewed and updated as indicated. Interim medical history since our last visit reviewed. Allergies and medications reviewed and updated. Outpatient Medications Prior to Visit  Medication Sig Dispense Refill   lithium carbonate (LITHOBID) 300 MG CR tablet Take 900 mg by mouth at bedtime.     QUEtiapine (SEROQUEL) 50 MG tablet Take 50 mg by mouth at bedtime.     cephALEXin (KEFLEX) 500 MG capsule Take 1 capsule (500 mg total) by mouth 3 (three) times daily. 21 capsule 0   omeprazole (PRILOSEC) 20 MG capsule Take 1 capsule (20 mg total) by mouth daily. 30 capsule 1   sertraline (ZOLOFT) 25 MG tablet Take 25 mg by mouth daily.      No facility-administered medications prior to visit.     Per HPI unless specifically indicated in ROS section below Review of Systems  Constitutional:  Negative for fatigue and fever.  HENT:  Negative for ear pain.   Eyes:  Negative for pain.  Respiratory:  Negative for cough and shortness of breath.   Cardiovascular:  Negative for chest pain, palpitations and  leg swelling.  Gastrointestinal:  Negative for abdominal pain.  Genitourinary:  Negative for dysuria.  Musculoskeletal:  Negative for arthralgias.  Neurological:  Negative for syncope, light-headedness and headaches.  Psychiatric/Behavioral:  Negative for dysphoric mood.   Objective:  BP (!) 89/57   Pulse 73   Temp 98.1 F (36.7 C) (Temporal)   Ht 6' 3.5" (1.918 m)   Wt 255 lb 12 oz (116 kg)   SpO2 98%   BMI 31.54 kg/m   Wt Readings from Last 3 Encounters:  07/14/20 255 lb 12 oz (116 kg)  06/08/20 252 lb (114.3 kg)  11/12/19 261 lb 8 oz (118.6 kg)      Physical Exam Constitutional:      Appearance: He is well-developed.  HENT:     Head: Normocephalic.     Right Ear: Hearing normal.     Left Ear: Hearing normal.     Nose: Nose normal.  Neck:     Thyroid: No thyroid mass or thyromegaly.     Vascular: No carotid bruit.     Trachea: Trachea normal.  Cardiovascular:     Rate and Rhythm: Normal rate and regular rhythm.     Pulses: Normal pulses.     Heart sounds: Heart sounds not distant. No  murmur heard.   No friction rub. No gallop.     Comments: No peripheral edema Pulmonary:     Effort: Pulmonary effort is normal. No respiratory distress.     Breath sounds: Normal breath sounds.  Skin:    General: Skin is warm and dry.     Findings: No rash.     Comments: Nodular cystic changes in bilateral axillae Drainage and slight erythema in left axillae.  Psychiatric:        Speech: Speech normal.        Behavior: Behavior normal.        Thought Content: Thought content normal.      Results for orders placed or performed in visit on 06/08/20  WOUND CULTURE   Specimen: Wound  Result Value Ref Range   MICRO NUMBER: [id number]    SPECIMEN QUALITY: Adequate    SOURCE: LEFT UNDER ARM    STATUS: FINAL    GRAM STAIN:      Many White blood cells seen No epithelial cells seen No organisms seen   ISOLATE 1: Proteus mirabilis (A)       Susceptibility   Proteus mirabilis -  AEROBIC CULT, GRAM STAIN NEGATIVE 1    AMOX/CLAVULANIC 4 Sensitive     AMPICILLIN <=2 Sensitive     AMPICILLIN/SULBACTAM <=2 Sensitive     CEFAZOLIN* <=4 Not Reportable      * For infections other than uncomplicated UTIcaused by E. coli, K. pneumoniae or P. mirabilis:Cefazolin is resistant if MIC > or = 8 mcg/mL.(Distinguishing susceptible versus intermediatefor isolates with MIC < or = 4 mcg/mL requiresadditional testing.)    CEFEPIME <=1 Sensitive     CEFTRIAXONE <=1 Sensitive     CIPROFLOXACIN <=0.25 Sensitive     LEVOFLOXACIN <=0.12 Sensitive     ERTAPENEM <=0.5 Sensitive     GENTAMICIN <=1 Sensitive     PIP/TAZO <=4 Sensitive     TOBRAMYCIN <=1 Sensitive     TRIMETH/SULFA* <=20 Sensitive      * For infections other than uncomplicated UTIcaused by E. coli, K. pneumoniae or P. mirabilis:Cefazolin is resistant if MIC > or = 8 mcg/mL.(Distinguishing susceptible versus intermediatefor isolates with MIC < or = 4 mcg/mL requiresadditional testing.)Legend:S = Susceptible  I = IntermediateR = Resistant  NS = Not susceptible* = Not tested  NR = Not reported**NN = See antimicrobic comments    This visit occurred during the SARS-CoV-2 public health emergency.  Safety protocols were in place, including screening questions prior to the visit, additional usage of staff PPE, and extensive cleaning of exam room while observing appropriate contact time as indicated for disinfecting solutions.   COVID 19 screen:  No recent travel or known exposure to COVID19 The patient denies respiratory symptoms of COVID 19 at this time. The importance of social distancing was discussed today.   Assessment and Plan Problem List Items Addressed This Visit     Bipolar affective disorder (HCC)    Check lithium level for toxicity.      Hypotension - Primary    Likely medication Side effect. Discuss lithium use with psychiatry if hypotension not improving with hydration.  Eval with labs for secondary cause.       Relevant Orders   CBC with Differential/Platelet (Completed)   Comprehensive metabolic panel (Completed)   TSH (Completed)   Suppurative hidradenitis    Use Hibiclens in armpits to clean.  Start longterm  antibiotics x 3 months.  Increase water intake. Referral to dermatology for more definitive  treatment.      Relevant Orders   Ambulatory referral to Dermatology   Other Visit Diagnoses     High risk medication use       Relevant Orders   Lithium level (Completed)      Meds ordered this encounter  Medications   doxycycline (VIBRA-TABS) 100 MG tablet    Sig: Take 1 tablet (100 mg total) by mouth daily.    Dispense:  60 tablet    Refill:  2   Orders Placed This Encounter  Procedures   CBC with Differential/Platelet   Comprehensive metabolic panel   TSH   Lithium level   Ambulatory referral to Dermatology    Referral Priority:   Routine    Referral Type:   Consultation    Referral Reason:   Specialty Services Required    Requested Specialty:   Dermatology    Number of Visits Requested:   1     Kerby Nora, MD

## 2020-07-15 LAB — LITHIUM LEVEL: Lithium Lvl: 0.6 mmol/L (ref 0.6–1.2)

## 2020-07-18 ENCOUNTER — Telehealth: Payer: Self-pay | Admitting: Family Medicine

## 2020-07-18 DIAGNOSIS — L732 Hidradenitis suppurativa: Secondary | ICD-10-CM

## 2020-07-18 NOTE — Telephone Encounter (Signed)
Mr. Chatmon called in wanted to know if he can keep he referral for someone here and get another referral to a dr in [facility] that specialize in HS. The Dr. Tammi Sou and the fax number is [phone] and the address is  [address] [place], [place] [address].

## 2020-07-26 NOTE — Assessment & Plan Note (Signed)
Start warm compresses on left armpit 3 times daily.  Start antibiotics x 7 days.

## 2020-08-03 DIAGNOSIS — F3189 Other bipolar disorder: Secondary | ICD-10-CM | POA: Diagnosis not present

## 2020-08-23 ENCOUNTER — Ambulatory Visit: Payer: Federal, State, Local not specified - PPO | Admitting: Family Medicine

## 2020-08-23 ENCOUNTER — Other Ambulatory Visit: Payer: Self-pay

## 2020-08-23 VITALS — BP 110/80 | HR 71 | Temp 98.2°F | Wt 259.5 lb

## 2020-08-23 DIAGNOSIS — F310 Bipolar disorder, current episode hypomanic: Secondary | ICD-10-CM | POA: Diagnosis not present

## 2020-08-23 DIAGNOSIS — Z5181 Encounter for therapeutic drug level monitoring: Secondary | ICD-10-CM | POA: Diagnosis not present

## 2020-08-23 DIAGNOSIS — I959 Hypotension, unspecified: Secondary | ICD-10-CM

## 2020-08-23 NOTE — Patient Instructions (Signed)
#  Low blood pressure - watch for lightheadedness or dizziness, fatigue or confusion - return if any of those signs - EKG is completely normal - Continue taking lithium as prescribed

## 2020-08-23 NOTE — Assessment & Plan Note (Signed)
BP normal today. Discussed warning signs for low BP and to monitor for these. Low suspicion this is a medication side effect.

## 2020-08-23 NOTE — Assessment & Plan Note (Addendum)
Stable on lithium and seroquel. EKG done today w/o signs of rhythm changes. Continue current therapy. Appreciate psych support.

## 2020-08-23 NOTE — Progress Notes (Signed)
   Subjective:     Eric Faulkner is a 40 y.o. male presenting for medication monitoring and Hypotension     HPI  #Low bp - bp was low for the last 2 visits  - today it is back to normal - no hx of EKG getting done - follows [facility]th[facility] [place] - no lightheadedness or dizziness - came in a few times for lithium side effects  - walks a little bit   Review of Systems   Social History   Tobacco Use  Smoking Status Former   Types: Cigars  Smokeless Tobacco Never        Objective:    BP Readings from Last 3 Encounters:  08/23/20 110/80  07/14/20 (!) 89/57  06/08/20 106/68   Wt Readings from Last 3 Encounters:  08/23/20 259 lb 8 oz (117.7 kg)  07/14/20 255 lb 12 oz (116 kg)  06/08/20 252 lb (114.3 kg)    BP 110/80   Pulse 71   Temp 98.2 F (36.8 C) (Temporal)   Wt 259 lb 8 oz (117.7 kg)   SpO2 96%   BMI 32.01 kg/m    Physical Exam Constitutional:      Appearance: Normal appearance. He is not ill-appearing or diaphoretic.  HENT:     Right Ear: External ear normal.     Left Ear: External ear normal.  Eyes:     General: No scleral icterus.    Extraocular Movements: Extraocular movements intact.     Conjunctiva/sclera: Conjunctivae normal.  Cardiovascular:     Rate and Rhythm: Normal rate and regular rhythm.     Heart sounds: No murmur heard. Pulmonary:     Effort: Pulmonary effort is normal. No respiratory distress.     Breath sounds: Normal breath sounds. No wheezing.  Musculoskeletal:     Cervical back: Neck supple.  Skin:    General: Skin is warm and dry.  Neurological:     Mental Status: He is alert. Mental status is at baseline.  Psychiatric:        Mood and Affect: Mood normal.     EKG: NSR, no prolonged Qtc, no st changes, no t wave abnormalities     Assessment & Plan:   Problem List Items Addressed This Visit       Cardiovascular and Mediastinum   Hypotension    BP normal today. Discussed  warning signs for low BP and to monitor for these. Low suspicion this is a medication side effect.          Other   Bipolar affective disorder (HCC)    Stable on lithium and seroquel. EKG done today w/o signs of rhythm changes. Continue current therapy. Appreciate psych support.        Other Visit Diagnoses     Medication monitoring encounter    -  Primary   Relevant Orders   EKG 12-Lead (Completed)        Return if symptoms worsen or fail to improve.  Lynnda Child, MD  This visit occurred during the SARS-CoV-2 public health emergency.  Safety protocols were in place, including screening questions prior to the visit, additional usage of staff PPE, and extensive cleaning of exam room while observing appropriate contact time as indicated for disinfecting solutions.

## 2020-09-04 DIAGNOSIS — M5412 Radiculopathy, cervical region: Secondary | ICD-10-CM | POA: Diagnosis not present

## 2020-09-04 DIAGNOSIS — M25512 Pain in left shoulder: Secondary | ICD-10-CM | POA: Diagnosis not present

## 2020-09-04 DIAGNOSIS — M9901 Segmental and somatic dysfunction of cervical region: Secondary | ICD-10-CM | POA: Diagnosis not present

## 2020-09-08 DIAGNOSIS — M9901 Segmental and somatic dysfunction of cervical region: Secondary | ICD-10-CM | POA: Diagnosis not present

## 2020-09-08 DIAGNOSIS — M25512 Pain in left shoulder: Secondary | ICD-10-CM | POA: Diagnosis not present

## 2020-09-08 DIAGNOSIS — M5412 Radiculopathy, cervical region: Secondary | ICD-10-CM | POA: Diagnosis not present

## 2020-09-11 DIAGNOSIS — M5412 Radiculopathy, cervical region: Secondary | ICD-10-CM | POA: Diagnosis not present

## 2020-09-11 DIAGNOSIS — M25512 Pain in left shoulder: Secondary | ICD-10-CM | POA: Diagnosis not present

## 2020-09-11 DIAGNOSIS — M9901 Segmental and somatic dysfunction of cervical region: Secondary | ICD-10-CM | POA: Diagnosis not present

## 2020-09-15 DIAGNOSIS — M25512 Pain in left shoulder: Secondary | ICD-10-CM | POA: Diagnosis not present

## 2020-09-15 DIAGNOSIS — M5412 Radiculopathy, cervical region: Secondary | ICD-10-CM | POA: Diagnosis not present

## 2020-09-15 DIAGNOSIS — M9901 Segmental and somatic dysfunction of cervical region: Secondary | ICD-10-CM | POA: Diagnosis not present

## 2020-09-20 NOTE — Assessment & Plan Note (Signed)
Likely medication Side effect. Discuss lithium use with psychiatry if hypotension not improving with hydration.  Eval with labs for secondary cause.

## 2020-09-20 NOTE — Assessment & Plan Note (Signed)
Use Hibiclens in armpits to clean.  Start longterm  antibiotics x 3 months.  Increase water intake. Referral to dermatology for more definitive treatment.

## 2020-09-20 NOTE — Assessment & Plan Note (Signed)
Check lithium level for toxicity.

## 2020-09-28 DIAGNOSIS — F3189 Other bipolar disorder: Secondary | ICD-10-CM | POA: Diagnosis not present

## 2020-10-04 DIAGNOSIS — M9901 Segmental and somatic dysfunction of cervical region: Secondary | ICD-10-CM | POA: Diagnosis not present

## 2020-10-04 DIAGNOSIS — M5412 Radiculopathy, cervical region: Secondary | ICD-10-CM | POA: Diagnosis not present

## 2020-10-04 DIAGNOSIS — M25512 Pain in left shoulder: Secondary | ICD-10-CM | POA: Diagnosis not present

## 2020-11-14 ENCOUNTER — Encounter: Payer: Self-pay | Admitting: Family Medicine

## 2020-11-14 ENCOUNTER — Other Ambulatory Visit: Payer: Self-pay

## 2020-11-14 ENCOUNTER — Telehealth (INDEPENDENT_AMBULATORY_CARE_PROVIDER_SITE_OTHER): Payer: Federal, State, Local not specified - PPO | Admitting: Family Medicine

## 2020-11-14 VITALS — Ht 75.5 in | Wt 260.0 lb

## 2020-11-14 DIAGNOSIS — R0600 Dyspnea, unspecified: Secondary | ICD-10-CM

## 2020-11-14 MED ORDER — PANTOPRAZOLE SODIUM 40 MG PO TBEC: 40.0000 mg | DELAYED_RELEASE_TABLET | Freq: Every day | ORAL | 3 refills | Status: DC

## 2020-11-14 NOTE — Progress Notes (Signed)
VIRTUAL VISIT Due to national recommendations of social distancing due to COVID 19, a virtual visit is felt to be most appropriate for this patient at this time.   I connected with the patient on 11/14/20 at  3:40 PM EDT by virtual telehealth platform and verified that I am speaking with the correct person using two identifiers.   I discussed the limitations, risks, security and privacy concerns of performing an evaluation and management service by  virtual telehealth platform and the availability of in person appointments. I also discussed with the patient that there may be a patient responsible charge related to this service. The patient expressed understanding and agreed to proceed.  Patient location: Home Provider Location: [facility] Participants: Kerby Nora and Linna Caprice   Chief Complaint  Patient presents with   Breathing Problem    Has trouble breathing when laying on his back.     History of Present Illness:  40 year old male with bipolar disorder.  IN last 1-2 month.. feeling SOB with lying flat.  If elevating head the head of the bed he has less SOB. No wheeze, no cough. No post nasal drip.  History of reflux or heartburn.Marland Kitchen occ feeling like something stuck in throat.  No chest pain. No swelling in ankles.   NO exertional SOB.   No med changes.   On lithium.Marland Kitchen tolerating better overall.  Has seasonal issues with mood problems... typically mood worse this time of year.  ALst year seen in ER for chest pain, Dx with reflux   Has history of GERD.   COVID 19 screen No recent travel or known exposure to COVID19 The patient denies respiratory symptoms of COVID 19 at this time.  The importance of social distancing was discussed today.   Review of Systems  Constitutional:  Negative for chills and fever.  HENT:  Negative for congestion and ear pain.   Eyes:  Negative for pain and redness.  Respiratory:  Negative for cough and shortness of breath.    Cardiovascular:  Negative for chest pain, palpitations and leg swelling.  Gastrointestinal:  Negative for abdominal pain, blood in stool, constipation, diarrhea, nausea and vomiting.  Genitourinary:  Negative for dysuria.  Musculoskeletal:  Negative for falls and myalgias.  Skin:  Negative for rash.  Neurological:  Negative for dizziness.  Psychiatric/Behavioral:  Negative for depression. The patient is not nervous/anxious.      Past Medical History:  Diagnosis Date   Low back pain     reports that he has quit smoking. His smoking use included cigars. He has never used smokeless tobacco. He reports current alcohol use. He reports that he does not use drugs.   Current Outpatient Medications:    doxycycline (VIBRA-TABS) 100 MG tablet, Take 1 tablet (100 mg total) by mouth daily., Disp: 60 tablet, Rfl: 2   lithium carbonate (LITHOBID) 300 MG CR tablet, Take 900 mg by mouth at bedtime., Disp: , Rfl:    QUEtiapine (SEROQUEL) 50 MG tablet, Take 50 mg by mouth at bedtime., Disp: , Rfl:    Observations/Objective: Height 6' 3.5" (1.918 m), weight 260 lb (117.9 kg).  Physical Exam  Physical Exam Constitutional:      General: The patient is not in acute distress. Pulmonary:     Effort: Pulmonary effort is normal. No respiratory distress.  Neurological:     Mental Status: The patient is alert and oriented to person, place, and time.  Psychiatric:        Mood  and Affect: Mood normal.        Behavior: Behavior normal.   Assessment and Plan    Problem List Items Addressed This Visit     Paroxysmal nocturnal dyspnea - Primary    No fluid overload.  No clear issue with lithium.Marland Kitchen levels monitored.  Most likely given nocturnal .. due to GERD.  Start with trigger avoidance and trial of pantoprazole.  If not improving , follow up for in person assessment.       I discussed the assessment and treatment plan with the patient. The patient was provided an opportunity to ask questions and  all were answered. The patient agreed with the plan and demonstrated an understanding of the instructions.   The patient was advised to call back or seek an in-person evaluation if the symptoms worsen or if the condition fails to improve as anticipated.     Kerby Nora, MD

## 2020-12-04 NOTE — Assessment & Plan Note (Signed)
No fluid overload.  No clear issue with lithium.Marland Kitchen levels monitored.  Most likely given nocturnal .. due to GERD.  Start with trigger avoidance and trial of pantoprazole.  If not improving , follow up for in person assessment.

## 2020-12-05 ENCOUNTER — Other Ambulatory Visit: Payer: Self-pay

## 2020-12-05 ENCOUNTER — Ambulatory Visit: Payer: Federal, State, Local not specified - PPO | Admitting: Primary Care

## 2020-12-05 ENCOUNTER — Ambulatory Visit (INDEPENDENT_AMBULATORY_CARE_PROVIDER_SITE_OTHER)
Admission: RE | Admit: 2020-12-05 | Discharge: 2020-12-05 | Disposition: A | Discharge status: Home / Self Care | Payer: Federal, State, Local not specified - PPO | Source: Ambulatory Visit | Attending: Primary Care | Admitting: Primary Care

## 2020-12-05 ENCOUNTER — Other Ambulatory Visit: Payer: Self-pay | Admitting: Primary Care

## 2020-12-05 VITALS — BP 142/80 | HR 68 | Temp 98.3°F | Resp 16 | Ht 76.0 in | Wt 273.2 lb

## 2020-12-05 DIAGNOSIS — R0600 Dyspnea, unspecified: Secondary | ICD-10-CM | POA: Insufficient documentation

## 2020-12-05 NOTE — Assessment & Plan Note (Addendum)
Occurring during the day and evening with different positions and scenarios.   Unclear etiology, GERD symptoms seem to have improved.  ECG today with NSR with rate of 70. No PAC/PCV, acute ST changes. With the exception of artifact, ECG appears similar to ECG from August 2022.  He was noted to be mildly hypertensive during visit, also no recheck. Discussed this today, recommend he monitor.   Chest xray pending today. Reviewed labs from July 2022.  Will cc PCP for comments and suggestions. Pulmonology vs cardiology?  Continue pantoprazole 40 mg daily for now given GERD benefit.

## 2020-12-05 NOTE — Patient Instructions (Signed)
Complete xray(s) prior to leaving today. I will notify you of your results once received.  Monitor your blood pressure and notify Dr. Ermalene Searing if you see readings consistently at or above 135 on top and 90 on bottom.  It was a pleasure meeting you!

## 2020-12-05 NOTE — Progress Notes (Signed)
Subjective:    Patient ID: Eric Faulkner, male    DOB: 20-Aug-1980, 40 y.o.   MRN: [id number]  HPI  [name] is a very pleasant 40 y.o. male patient of Dr. Ermalene Searing with a history of hypotension, GERD, seasonal allergies, paroxysmal nocturnal dyspnea, Bipolar disorder who presents today to discuss dyspnea.   Evaluated by Dr. Ermalene Searing virtually on 11/14/20 for a 1-2 month history of dyspnea while laying his back. He also endorsed a sensation to his throat, no other symptoms. During this visit it was presumed that GERD was likely the cause given nocturnal symptoms. He was prescribed pantoprazole and told to avoid GERD triggers. He was also told to come to the office if symptoms persisted.  Today he endorses continued SOB when initially laying down, when initially getting into the car, and with some strenuous sexual activity. He's also noticed a sensation to the left upper extremity with radiation to left chest, daily cough.   He denies chest pain, nausea, a history of asthma, a family history of heart failure. Since initiated on Lithium he's felt the best he has in 20 years.   He's taking the pantoprazole 40 mg most nights and has noticed improvement with esophageal reflux but not with dyspnea.   BP Readings from Last 3 Encounters:  12/05/20 136/90  08/23/20 110/80  07/14/20 (!) 89/57      Review of Systems  Constitutional:  Negative for fever.  Respiratory:  Positive for shortness of breath. Negative for cough.   Cardiovascular:  Negative for chest pain.  Gastrointestinal:  Negative for abdominal pain, nausea and vomiting.       Esophageal burning and throat fullness has improved         Past Medical History:  Diagnosis Date   Low back pain     Social History   Socioeconomic History   Marital status: Divorced    Spouse name: Not on file   Number of children: Not on file   Years of education: Not on file   Highest education level: Not on file  Occupational  History   Not on file  Tobacco Use   Smoking status: Former    Types: Cigars   Smokeless tobacco: Never  Substance and Sexual Activity   Alcohol use: Yes    Alcohol/week: 0.0 standard drinks   Drug use: No   Sexual activity: Yes  Other Topics Concern   Not on file  Social History Narrative   Works in Research [employer], political party.   Married, 3 kids  ( 18 months, 78yrs and 11 yrs)      Social Determinants of Corporate investment banker Strain: Not on file  Food Insecurity: Not on file  Transportation Needs: Not on file  Physical Activity: Not on file  Stress: Not on file  Social Connections: Not on file  Intimate Partner Violence: Not on file    No past surgical history on file.  Family History  Problem Relation Age of Onset   Cancer Mother 33       breast   Cancer Maternal Grandfather        throat   ALS Paternal Uncle     No Known Allergies  Current Outpatient Medications on File Prior to Visit  Medication Sig Dispense Refill   doxycycline (VIBRA-TABS) 100 MG tablet Take 1 tablet (100 mg total) by mouth daily. 60 tablet 2   lithium carbonate (LITHOBID) 300 MG CR tablet Take 900 mg by mouth at bedtime.  pantoprazole (PROTONIX) 40 MG tablet Take 1 tablet (40 mg total) by mouth daily. 30 tablet 3   QUEtiapine (SEROQUEL) 50 MG tablet Take 50 mg by mouth at bedtime.     No current facility-administered medications on file prior to visit.    BP 136/90   Pulse 68   Temp 98.3 F (36.8 C)   Resp 16   Ht 6\' 4"  (1.93 m)   Wt 273 lb 4 oz (123.9 kg)   SpO2 96%   BMI 33.26 kg/m  Objective:   Physical Exam Cardiovascular:     Rate and Rhythm: Normal rate and regular rhythm.  Pulmonary:     Effort: Pulmonary effort is normal.     Breath sounds: Normal breath sounds. No wheezing or rales.  Musculoskeletal:     Cervical back: Neck supple.  Skin:    General: Skin is warm and dry.  Neurological:     Mental Status: He is alert and oriented to person, place, and time.           Assessment & Plan:      This visit occurred during the SARS-CoV-2 public health emergency.  Safety protocols were in place, including screening questions prior to the visit, additional usage of staff PPE, and extensive cleaning of exam room while observing appropriate contact time as indicated for disinfecting solutions.

## 2020-12-06 ENCOUNTER — Other Ambulatory Visit: Payer: Self-pay | Admitting: Family Medicine

## 2020-12-06 NOTE — Progress Notes (Signed)
Error

## 2020-12-06 NOTE — Telephone Encounter (Signed)
Sent message to patient to let know Jae Dire is out of office until next week. Have sent message to Dr. Ermalene Searing to see if she has anything she would like to add for patient.

## 2020-12-11 ENCOUNTER — Encounter: Payer: Self-pay | Admitting: Family Medicine

## 2020-12-12 ENCOUNTER — Other Ambulatory Visit (INDEPENDENT_AMBULATORY_CARE_PROVIDER_SITE_OTHER): Payer: Federal, State, Local not specified - PPO

## 2020-12-12 ENCOUNTER — Other Ambulatory Visit: Payer: Self-pay | Admitting: Family Medicine

## 2020-12-12 ENCOUNTER — Other Ambulatory Visit: Payer: Self-pay

## 2020-12-12 ENCOUNTER — Ambulatory Visit: Payer: Federal, State, Local not specified - PPO | Admitting: Family Medicine

## 2020-12-12 DIAGNOSIS — Z113 Encounter for screening for infections with a predominantly sexual mode of transmission: Secondary | ICD-10-CM | POA: Diagnosis not present

## 2020-12-13 ENCOUNTER — Ambulatory Visit
Admission: RE | Admit: 2020-12-13 | Discharge: 2020-12-13 | Disposition: A | Discharge status: Home / Self Care | Payer: Federal, State, Local not specified - PPO | Source: Ambulatory Visit | Attending: Primary Care | Admitting: Primary Care

## 2020-12-13 DIAGNOSIS — R0600 Dyspnea, unspecified: Secondary | ICD-10-CM | POA: Diagnosis not present

## 2020-12-13 DIAGNOSIS — R0602 Shortness of breath: Secondary | ICD-10-CM | POA: Diagnosis not present

## 2020-12-13 LAB — HEPATITIS PANEL, ACUTE
Hep A IgM: NONREACTIVE
Hep B C IgM: NONREACTIVE
Hepatitis B Surface Ag: NONREACTIVE
Hepatitis C Ab: NONREACTIVE
SIGNAL TO CUT-OFF: <0.02 (ref <1.00)

## 2020-12-13 LAB — RPR: RPR Ser Ql: NONREACTIVE

## 2020-12-13 LAB — HSV(HERPES SIMPLEX VRS) I + II AB-IGG
HAV 1 IGG,TYPE SPECIFIC AB: <0.9 index
HSV 2 IGG,TYPE SPECIFIC AB: <0.9 index

## 2020-12-13 LAB — HIV ANTIBODY (ROUTINE TESTING W REFLEX): HIV 1&2 Ab, 4th Generation: NONREACTIVE

## 2020-12-14 LAB — C. TRACHOMATIS/N. GONORRHOEAE RNA
C. trachomatis RNA, TMA: NOT DETECTED
N. gonorrhoeae RNA, TMA: NOT DETECTED

## 2020-12-15 ENCOUNTER — Ambulatory Visit: Payer: Federal, State, Local not specified - PPO | Admitting: Family Medicine

## 2020-12-15 ENCOUNTER — Encounter: Payer: Self-pay | Admitting: Family Medicine

## 2020-12-15 ENCOUNTER — Other Ambulatory Visit: Payer: Self-pay

## 2020-12-15 VITALS — BP 128/82 | HR 78 | Temp 97.5°F | Ht 76.0 in | Wt 273.5 lb

## 2020-12-15 DIAGNOSIS — R0600 Dyspnea, unspecified: Secondary | ICD-10-CM | POA: Diagnosis not present

## 2020-12-15 DIAGNOSIS — R002 Palpitations: Secondary | ICD-10-CM

## 2020-12-15 DIAGNOSIS — F1010 Alcohol abuse, uncomplicated: Secondary | ICD-10-CM

## 2020-12-15 DIAGNOSIS — J986 Disorders of diaphragm: Secondary | ICD-10-CM | POA: Diagnosis not present

## 2020-12-15 LAB — CBC WITH DIFFERENTIAL/PLATELET
Basophils Absolute: 0.1 10*3/uL (ref 0.0–0.1)
Basophils Relative: 0.7 % (ref 0.0–3.0)
Eosinophils Absolute: 0.4 10*3/uL (ref 0.0–0.7)
Eosinophils Relative: 5.2 % — ABNORMAL HIGH (ref 0.0–5.0)
HCT: 42.9 % (ref 39.0–52.0)
Hemoglobin: 14.4 g/dL (ref 13.0–17.0)
Lymphocytes Relative: 27.3 % (ref 12.0–46.0)
Lymphs Abs: 2.1 10*3/uL (ref 0.7–4.0)
MCHC: 33.4 g/dL (ref 30.0–36.0)
MCV: 81.9 fl (ref 78.0–100.0)
Monocytes Absolute: 0.7 10*3/uL (ref 0.1–1.0)
Monocytes Relative: 8.4 % (ref 3.0–12.0)
Neutro Abs: 4.5 10*3/uL (ref 1.4–7.7)
Neutrophils Relative %: 58.4 % (ref 43.0–77.0)
Platelets: 247 10*3/uL (ref 150.0–400.0)
RBC: 5.24 Mil/uL (ref 4.22–5.81)
RDW: 14.2 % (ref 11.5–15.5)
WBC: 7.8 10*3/uL (ref 4.0–10.5)

## 2020-12-15 LAB — COMPREHENSIVE METABOLIC PANEL
ALT: 19 U/L (ref 0–53)
AST: 14 U/L (ref 0–37)
Albumin: 4.3 g/dL (ref 3.5–5.2)
Alkaline Phosphatase: 57 U/L (ref 39–117)
BUN: 15 mg/dL (ref 6–23)
CO2: 29 mEq/L (ref 19–32)
Calcium: 9.3 mg/dL (ref 8.4–10.5)
Chloride: 105 mEq/L (ref 96–112)
Creatinine, Ser: 1.2 mg/dL (ref 0.40–1.50)
GFR: 74.84 mL/min (ref >60.00)
Glucose, Bld: 83 mg/dL (ref 70–99)
Potassium: 3.9 mEq/L (ref 3.5–5.1)
Sodium: 141 mEq/L (ref 135–145)
Total Bilirubin: 0.9 mg/dL (ref 0.2–1.2)
Total Protein: 7.1 g/dL (ref 6.0–8.3)

## 2020-12-15 LAB — TSH: TSH: 1.55 u[IU]/mL (ref 0.35–5.50)

## 2020-12-15 NOTE — Progress Notes (Signed)
Patient ID: Eric Faulkner, male    DOB: 1980-09-19, 40 y.o.   MRN: [id number]  This visit was conducted in person.  BP 128/82   Pulse 78   Temp (!) 97.5 F (36.4 C) (Temporal)   Ht 6\' 4"  (1.93 m)   Wt 273 lb 8 oz (124.1 kg)   SpO2 97%   BMI 33.29 kg/m    CC:  Chief Complaint  Patient presents with   Discuss Sniff Test Results   Palpitations    Saw K. Clark on 12/05/20    Subjective:   HPI: Eric Faulkner is a 40 y.o. male presenting on 12/15/2020 for Discuss Sniff Test Results and Palpitations (Saw K. Clark on 12/05/20)   40 year old male presents for follow up PND and palpitations.   Pantoprazole had helped with reflux but did not help with nighttime SOB with lying down.   Reviewed note from 43 on 12/05/2020 SOB when initially laying down, when initially getting into the car, and with some strenuous sexual activity. He's also noticed a sensation to the left upper extremity with radiation to left chest, daily cough.    CXR showed abd  right hemi diaphragm Sniff test was abnormal.  EKG:  NSR with rate of 70. No PAC/PCV, acute ST changes. With the exception of artifact, ECG appears similar to ECG from August 2022.   Today he reports he has noted heart skipping beats in last 3-4 weeks.  Dyspnea with lying down, strenuous activity.  No chest pain.  BP Readings from Last 3 Encounters:  12/15/20 128/82  12/05/20 (!) 142/80  08/23/20 110/80   He drinks 1 pint of liquor a day in the last year! He is a smoker but has quit in the last week.     Relevant past medical, surgical, family and social history reviewed and updated as indicated. Interim medical history since our last visit reviewed. Allergies and medications reviewed and updated. Outpatient Medications Prior to Visit  Medication Sig Dispense Refill   lithium carbonate (LITHOBID) 300 MG CR tablet Take 900 mg by mouth at bedtime.     pantoprazole (PROTONIX) 40 MG tablet Take 1 tablet (40 mg total)  by mouth daily. 30 tablet 3   QUEtiapine (SEROQUEL) 50 MG tablet Take 50 mg by mouth at bedtime.     doxycycline (VIBRA-TABS) 100 MG tablet Take 1 tablet (100 mg total) by mouth daily. (Patient not taking: Reported on 12/15/2020) 60 tablet 2   No facility-administered medications prior to visit.     Per HPI unless specifically indicated in ROS section below Review of Systems  Constitutional:  Negative for fatigue and fever.  HENT:  Negative for ear pain.   Eyes:  Negative for pain.  Respiratory:  Positive for shortness of breath. Negative for cough.   Cardiovascular:  Positive for palpitations. Negative for chest pain and leg swelling.  Gastrointestinal:  Negative for abdominal pain.  Genitourinary:  Negative for dysuria.  Musculoskeletal:  Negative for arthralgias.  Neurological:  Negative for syncope, light-headedness and headaches.  Psychiatric/Behavioral:  Negative for dysphoric mood.   Objective:  BP 128/82   Pulse 78   Temp (!) 97.5 F (36.4 C) (Temporal)   Ht 6\' 4"  (1.93 m)   Wt 273 lb 8 oz (124.1 kg)   SpO2 97%   BMI 33.29 kg/m   Wt Readings from Last 3 Encounters:  12/15/20 273 lb 8 oz (124.1 kg)  12/05/20 273 lb 4 oz (123.9 kg)  11/14/20 260 lb (117.9 kg)      Physical Exam    Results for orders placed or performed in visit on 12/12/20  C. trachomatis/N. gonorrhoeae RNA   Specimen: GU  Result Value Ref Range   C. trachomatis RNA, TMA NOT DETECTED NOT DETECTED   N. gonorrhoeae RNA, TMA NOT DETECTED NOT DETECTED  HSV(herpes simplex vrs) 1+2 ab-IgG  Result Value Ref Range   HAV 1 IGG,TYPE SPECIFIC AB <0.90 index   HSV 2 IGG,TYPE SPECIFIC AB <0.90 index  Hepatitis panel, acute  Result Value Ref Range   Hep A IgM NON-REACTIVE NON-REACTIVE   Hepatitis B Surface Ag NON-REACTIVE NON-REACTIVE   Hep B C IgM NON-REACTIVE NON-REACTIVE   Hepatitis C Ab NON-REACTIVE NON-REACTIVE   SIGNAL TO CUT-OFF <0.02 <1.00  RPR  Result Value Ref Range   RPR Ser Ql NON-REACTIVE  NON-REACTIVE  HIV Antibody (routine testing w rflx)  Result Value Ref Range   HIV 1&2 Ab, 4th Generation NON-REACTIVE NON-REACTIVE    This visit occurred during the SARS-CoV-2 public health emergency.  Safety protocols were in place, including screening questions prior to the visit, additional usage of staff PPE, and extensive cleaning of exam room while observing appropriate contact time as indicated for disinfecting solutions.   COVID 19 screen:  No recent travel or known exposure to COVID19 The patient denies respiratory symptoms of COVID 19 at this time. The importance of social distancing was discussed today.   Assessment and Plan    Problem List Items Addressed This Visit     Alcohol abuse   Dyspnea    Likely multifactorial.  In part due to hemidiaphragm dysfunction as well as inactivity and deconditioning. Eval with labs for secondary cause.      Relevant Orders   Ambulatory referral to Pulmonology   Ambulatory referral to Cardiology   Elevated hemidiaphragm    UNclear cause.. refer to Pulmonary for further recommendations.      Relevant Orders   Ambulatory referral to Pulmonology   Palpitations - Primary     Eval for secondary cause.  Discussed excessive alcohol use likely contributing. Stay away from caffeine, decongestant.  Good job quitting smoking.  Decrease alcohol intake over time.. Recommended limit no more than 2 alcoholic beverages at a time.  Refer to cardiology for [facility] monitor.   Go to ER if severe shortness of breath or chest pain.      Relevant Orders   CBC with Differential/Platelet (Completed)   TSH (Completed)   Comprehensive metabolic panel (Completed)   Ambulatory referral to Cardiology   Orders Placed This Encounter  Procedures   CBC with Differential/Platelet   TSH   Comprehensive metabolic panel   Ambulatory referral to Pulmonology    Referral Priority:   Routine    Referral Type:   Consultation    Referral Reason:   Specialty  Services Required    Requested Specialty:   Pulmonary Disease    Number of Visits Requested:   1   Ambulatory referral to Cardiology    Referral Priority:   Urgent    Referral Type:   Consultation    Referral Reason:   Specialty Services Required    Requested Specialty:   Cardiology    Number of Visits Requested:   1    Kerby Nora, MD

## 2020-12-15 NOTE — Patient Instructions (Addendum)
Please stop at the lab to have labs drawn.   Stay away from caffeine, decongestant.  Good job quitting smoking.  Decrease alcohol intake over time.. Recommended limit no more than 2 alcoholic beverages at a time. We will work on referrals to cardiology and pulmonary for further evaluation.  Go to ER if severe shortness of breath or chest pain.

## 2020-12-17 ENCOUNTER — Encounter: Payer: Self-pay | Admitting: Family Medicine

## 2020-12-18 DIAGNOSIS — F3189 Other bipolar disorder: Secondary | ICD-10-CM | POA: Diagnosis not present

## 2020-12-19 ENCOUNTER — Ambulatory Visit: Payer: Federal, State, Local not specified - PPO | Admitting: Cardiovascular Disease

## 2020-12-19 ENCOUNTER — Other Ambulatory Visit: Payer: Self-pay

## 2020-12-19 ENCOUNTER — Encounter: Payer: Self-pay | Admitting: Cardiovascular Disease

## 2020-12-19 VITALS — BP 130/90 | HR 82 | Ht 76.0 in | Wt 275.5 lb

## 2020-12-19 DIAGNOSIS — R079 Chest pain, unspecified: Secondary | ICD-10-CM | POA: Diagnosis not present

## 2020-12-19 DIAGNOSIS — Z01810 Encounter for preprocedural cardiovascular examination: Secondary | ICD-10-CM | POA: Diagnosis not present

## 2020-12-19 DIAGNOSIS — F319 Bipolar disorder, unspecified: Secondary | ICD-10-CM | POA: Diagnosis not present

## 2020-12-19 DIAGNOSIS — I209 Angina pectoris, unspecified: Secondary | ICD-10-CM

## 2020-12-19 DIAGNOSIS — R0602 Shortness of breath: Secondary | ICD-10-CM

## 2020-12-19 MED ORDER — METOPROLOL TARTRATE 100 MG PO TABS: 100.0000 mg | ORAL_TABLET | Freq: Once | ORAL | 0 refills | Status: DC

## 2020-12-19 NOTE — Progress Notes (Signed)
Cardiology Office Note  Date:  12/20/2020   ID:  Eric Faulkner, DOB 08-11-1980, MRN [id number]  PCP:  Excell Seltzer, MD   Chief Complaint  Patient presents with   New Patient (Initial Visit)    Ref by Dr. Ermalene Searing for chest pain & palpitations. Patient c/o chest pain/discomfort that at times can radiate down left arm with numbness & tingling in left arm and leg, shortness of breath, palpitations. Medications reviewed by the patient verbally.     HPI:   [name] is a 40 year old gentleman with past medical history of Alcohol Going through divorce hypotension, GERD, seasonal allergies, paroxysmal nocturnal dyspnea,  Bipolar disorder on lithium Chronic SOB Who presents for referral from Dr. Ermalene Searing for consultation of his chest pain and palpitations  Typically in good health though reports having Chest pain x 1 month SOB for 6 weeks Etiology unclear  Some sx at rest, Chest pain with exertion, concerning for angina  Severe sx  yesterday, discomfort in chest Can feel in throat  Does not feel he has a strong family history Does not feel symptoms are secondary to stress Unclear if symptoms from side effects from lithium or other medication Would prefer not to come off the lithium as he feels well on the medication otherwise  EKG personally reviewed by myself on todays visit NSR rate 82 bpm no significant ST-T wave changes  PMH:   has a past medical history of Low back pain.  PSH:   History reviewed. No pertinent surgical history.  Current Outpatient Medications  Medication Sig Dispense Refill   lithium carbonate (LITHOBID) 300 MG CR tablet Take 900 mg by mouth at bedtime.     metoprolol tartrate (LOPRESSOR) 100 MG tablet Take 1 tablet (100 mg total) by mouth once for 1 dose. Take 2 hours before your Cardiac CTA procedure 1 tablet 0   pantoprazole (PROTONIX) 40 MG tablet Take 1 tablet (40 mg total) by mouth daily. 30 tablet 3   QUEtiapine (SEROQUEL) 50 MG tablet  Take 50 mg by mouth at bedtime.     doxycycline (VIBRA-TABS) 100 MG tablet Take 1 tablet (100 mg total) by mouth daily. (Patient not taking: Reported on 12/15/2020) 60 tablet 2   No current facility-administered medications for this visit.     Allergies:   Patient has no known allergies.   Social History:  The patient  reports that he has quit smoking. His smoking use included cigars. He has never used smokeless tobacco. He reports current alcohol use. He reports that he does not use drugs.   Family History:   family history includes ALS in his paternal uncle; Cancer in his maternal grandfather; Cancer (age of onset: 41) in his mother.    Review of Systems: Review of Systems  Constitutional: Negative.   HENT: Negative.    Respiratory: Negative.    Cardiovascular:  Positive for chest pain.  Gastrointestinal: Negative.   Musculoskeletal: Negative.   Neurological: Negative.   Psychiatric/Behavioral: Negative.    All other systems reviewed and are negative.   PHYSICAL EXAM: VS:  BP 130/90 (BP Location: Right Arm, Patient Position: Sitting, Cuff Size: Large)   Pulse 82   Ht 6\' 4"  (1.93 m)   Wt 275 lb 8 oz (125 kg)   SpO2 98%   BMI 33.53 kg/m  , BMI Body mass index is 33.53 kg/m. GEN: Well nourished, well developed, in no acute distress HEENT: normal Neck: no JVD, carotid bruits, or masses Cardiac: RRR; no  murmurs, rubs, or gallops,no edema  Respiratory:  clear to auscultation bilaterally, normal work of breathing GI: soft, nontender, nondistended, + BS MS: no deformity or atrophy Skin: warm and dry, no rash Neuro:  Strength and sensation are intact Psych: euthymic mood, full affect   Recent Labs: 12/15/2020: ALT 19; BUN 15; Creatinine, Ser 1.20; Hemoglobin 14.4; Platelets 247.0; Potassium 3.9; Sodium 141; TSH 1.55    Lipid Panel Lab Results  Component Value Date   CHOL 195 08/06/2019   HDL 38.40 (L) 08/06/2019   LDLCALC 143 (H) 08/06/2019   TRIG 68.0 08/06/2019       Wt Readings from Last 3 Encounters:  12/19/20 275 lb 8 oz (125 kg)  12/15/20 273 lb 8 oz (124.1 kg)  12/05/20 273 lb 4 oz (123.9 kg)       ASSESSMENT AND PLAN:  Problem List Items Addressed This Visit     Bipolar affective disorder (HCC)   Other Visit Diagnoses     Chest pain of uncertain etiology    -  Primary   Relevant Orders   EKG 12-Lead   CT CORONARY MORPH W/CTA COR W/SCORE W/CA W/CM &/OR WO/CM   SOB (shortness of breath)       Relevant Orders   CT CORONARY MORPH W/CTA COR W/SCORE W/CA W/CM &/OR WO/CM   Pre-procedural cardiovascular examination          Chest pain/angina Concerning symptoms, presenting with exertion Seem to be getting worse, severe symptoms yesterday also some associated shortness of breath Discussed various treatment options, We have recommended cardiac CTA given anginal symptoms, shortness of breath Benefits of the procedure discussed with him Preauthorization ordered, will take metoprolol night before and morning of the procedure We will call him with results If no significant coronary disease noted, and no other etiology discussed, will need to look for other etiology, such as medication side effects  Shortness of breath Symptoms on exertion, etiology unclear, cardiac CTA as above  Bipolar disorder Reports symptoms well controlled on lithium   Total encounter time more than 60 minutes  Greater than 50% was spent in counseling and coordination of care with the patient  Patient seen in consultation for Dr. Ermalene Searing will be referred back to her office for ongoing care of the issues detailed above  Signed, Dossie Arbour, M.D., Ph.D. [facility] [place], [place] [phone]

## 2020-12-19 NOTE — Patient Instructions (Signed)
Medication Instructions:  No changes  If you need a refill on your cardiac medications before your next appointment, please call your pharmacy.   Lab work: No new labs needed  Testing/Procedures: Cardiac CTA  Follow-Up: At [facility], you and your health needs are our priority.  As part of our continuing mission to provide you with exceptional heart care, we have created designated Provider Care Teams.  These Care Teams include your primary Cardiologist (physician) and Advanced Practice Providers (APPs -  Physician Assistants and Nurse Practitioners) who all work together to provide you with the care you need, when you need it.  You will need a follow up appointment as needed  Providers on your designated Care Team:   Nicolasa Ducking, NP Eula Listen, PA-C Cadence Fransico Michael, [place]  COVID-19 Vaccine Information can be found at: [email or web address] For questions related to vaccine distribution or appointments, please email vaccine@[facility] [email or web address] or call [phone].    Cardiac (coronary) CTA   [facility] [address] [place], [place] [address] [phone]  Date: 12/28/2020   Time: 10:15 am Please arrive 15 mins early for check-in and test prep.  Please follow these instructions carefully:  Hold all erectile dysfunction medications at least 3 days (72 hrs) prior to test.  On the Night Before the Test: Be sure to Drink plenty of water. Do not consume any caffeinated/decaffeinated beverages or chocolate 12 hours prior to your test. This includes decaf as well  On the Day of the Test: Drink plenty of water until 1 hour prior to the test. Do not eat any food 4 hours prior to the test. You may take your regular medications prior to the test.  Take metoprolol (Lopressor) two hours prior to test. Lopressor 100 mg This was sent in to your pharmacy for pick-up      After  the Test: Drink plenty of water. After receiving IV contrast, you may experience a mild flushed feeling. This is normal. On occasion, you may experience a mild rash up to 24 hours after the test. This is not dangerous. If this occurs, you can take Benadryl 25 mg and increase your fluid intake (to flush out the kidneys) If you experience trouble breathing, this can be serious. If it is severe call 911 IMMEDIATELY. If it is mild, please call our office. If you take any of these medications:  Glipizide/Metformin Avandament,  Glucavance,  please do not take 48 hours after completing test unless otherwise instructed.   Once we have confirmed authorization from your insurance company, we will call you to set up a date and time for your test. Based on how quickly your insurance processes prior authorizations requests, please allow up to 4 weeks to be contacted for scheduling your Cardiac CT appointment. Be advised that routine Cardiac CT appointments could be scheduled as many as 8 weeks after your provider has ordered it.  For scheduling needs, including cancellations and rescheduling, please call [place], [phone].

## 2020-12-27 ENCOUNTER — Telehealth (HOSPITAL_COMMUNITY): Payer: Self-pay | Admitting: *Deleted

## 2020-12-27 NOTE — Telephone Encounter (Signed)
Attempted to call patient regarding upcoming cardiac CT appointment. Voicemail box full and unable to leave a message.  Daily Crate RN Navigator Cardiac Imaging [facility] [phone] Office [phone] Cell  

## 2020-12-28 ENCOUNTER — Other Ambulatory Visit: Payer: Self-pay

## 2020-12-28 ENCOUNTER — Ambulatory Visit: Admission: RE | Admit: 2020-12-28 | Payer: Federal, State, Local not specified - PPO | Source: Ambulatory Visit

## 2020-12-28 ENCOUNTER — Ambulatory Visit
Admission: RE | Admit: 2020-12-28 | Discharge: 2020-12-28 | Disposition: A | Discharge status: Home / Self Care | Payer: Federal, State, Local not specified - PPO | Source: Ambulatory Visit | Attending: Cardiovascular Disease | Admitting: Cardiovascular Disease

## 2020-12-28 DIAGNOSIS — R079 Chest pain, unspecified: Secondary | ICD-10-CM

## 2020-12-28 DIAGNOSIS — R0602 Shortness of breath: Secondary | ICD-10-CM | POA: Insufficient documentation

## 2020-12-28 DIAGNOSIS — I209 Angina pectoris, unspecified: Secondary | ICD-10-CM | POA: Diagnosis not present

## 2020-12-28 MED ORDER — METOPROLOL TARTRATE 5 MG/5ML IV SOLN
10.0000 mg | Freq: Once | INTRAVENOUS | Status: AC
  Administered 2020-12-28: 10 mg via INTRAVENOUS

## 2020-12-28 MED ORDER — IOHEXOL 350 MG/ML SOLN
100.0000 mL | Freq: Once | INTRAVENOUS | Status: AC | PRN
  Administered 2020-12-28: 100 mL via INTRAVENOUS

## 2020-12-28 MED ORDER — NITROGLYCERIN 0.4 MG SL SUBL
0.8000 mg | SUBLINGUAL_TABLET | Freq: Once | SUBLINGUAL | Status: AC
  Administered 2020-12-28: 0.8 mg via SUBLINGUAL

## 2020-12-28 NOTE — Progress Notes (Signed)
Patient tolerated CT well. Drank water after. Vital signs stable encourage to drink water throughout day.Reasons explained and verbalized understanding. Ambulated steady gait.  

## 2021-01-08 ENCOUNTER — Other Ambulatory Visit: Payer: Self-pay | Admitting: Family Medicine

## 2021-01-08 NOTE — Telephone Encounter (Signed)
Last office visit 12/15/20 for Discuss Sniff Test & Palpitations.  Last refilled 07/14/20 for #60 with 2 refills for suppurative hidradenitis.  No future appointments with PCP.

## 2021-01-09 ENCOUNTER — Telehealth: Payer: Self-pay

## 2021-01-09 NOTE — Telephone Encounter (Signed)
Able to reach pt regarding his recent CCTA Dr. Mariah Milling had a chance to review his results and advised   "Cardiac CTA  No significant coronary blockages or calcium  Chest pain likely from noncardiac source   Of note there is elevated diaphragm on right, perhaps this could contribute to shortness of breath.  Would not think the diaphragm elevation would cause chest pain   We can refer to pulmonary if he would like further evaluation of elevated right hemidiaphragm "  [name] very thankful for the phone call of his results, all questions and concerns were address with nothing further at this time. Will see at next schedule f/u appt with cardiology.   Pt already has an appt with Dr. Jayme Cloud with pulmonology on 1/11

## 2021-01-24 ENCOUNTER — Encounter: Payer: Self-pay | Admitting: Pulmonary Disease

## 2021-01-24 ENCOUNTER — Other Ambulatory Visit: Payer: Self-pay

## 2021-01-24 ENCOUNTER — Ambulatory Visit: Payer: Federal, State, Local not specified - PPO | Admitting: Pulmonary Disease

## 2021-01-24 ENCOUNTER — Other Ambulatory Visit
Admission: RE | Admit: 2021-01-24 | Discharge: 2021-01-24 | Disposition: A | Discharge status: Home / Self Care | Payer: Federal, State, Local not specified - PPO | Source: Ambulatory Visit | Attending: Pulmonary Disease | Admitting: Pulmonary Disease

## 2021-01-24 VITALS — BP 138/90 | HR 85 | Temp 99.1°F | Ht 76.0 in | Wt 274.6 lb

## 2021-01-24 DIAGNOSIS — D721 Eosinophilia, unspecified: Secondary | ICD-10-CM | POA: Insufficient documentation

## 2021-01-24 DIAGNOSIS — R0600 Dyspnea, unspecified: Secondary | ICD-10-CM | POA: Diagnosis not present

## 2021-01-24 DIAGNOSIS — Q791 Other congenital malformations of diaphragm: Secondary | ICD-10-CM | POA: Insufficient documentation

## 2021-01-24 DIAGNOSIS — K219 Gastro-esophageal reflux disease without esophagitis: Secondary | ICD-10-CM | POA: Diagnosis not present

## 2021-01-24 DIAGNOSIS — J986 Disorders of diaphragm: Secondary | ICD-10-CM

## 2021-01-24 DIAGNOSIS — R0602 Shortness of breath: Secondary | ICD-10-CM | POA: Insufficient documentation

## 2021-01-24 DIAGNOSIS — Z87891 Personal history of nicotine dependence: Secondary | ICD-10-CM | POA: Insufficient documentation

## 2021-01-24 DIAGNOSIS — T7840XA Allergy, unspecified, initial encounter: Secondary | ICD-10-CM | POA: Diagnosis not present

## 2021-01-24 DIAGNOSIS — Z8616 Personal history of COVID-19: Secondary | ICD-10-CM | POA: Insufficient documentation

## 2021-01-24 LAB — CBC WITH DIFFERENTIAL/PLATELET
Abs Immature Granulocytes: 0.02 10*3/uL (ref 0.00–0.07)
Basophils Absolute: 0.1 10*3/uL (ref 0.0–0.1)
Basophils Relative: 1 %
Eosinophils Absolute: 0.3 10*3/uL (ref 0.0–0.5)
Eosinophils Relative: 4 %
HCT: 42.4 % (ref 39.0–52.0)
Hemoglobin: 14.7 g/dL (ref 13.0–17.0)
Immature Granulocytes: 0 %
Lymphocytes Relative: 25 %
Lymphs Abs: 2 10*3/uL (ref 0.7–4.0)
MCH: 27.6 pg (ref 26.0–34.0)
MCHC: 34.7 g/dL (ref 30.0–36.0)
MCV: 79.5 fL — ABNORMAL LOW (ref 80.0–100.0)
Monocytes Absolute: 0.7 10*3/uL (ref 0.1–1.0)
Monocytes Relative: 9 %
Neutro Abs: 4.9 10*3/uL (ref 1.7–7.7)
Neutrophils Relative %: 61 %
Platelets: 278 10*3/uL (ref 150–400)
RBC: 5.33 MIL/uL (ref 4.22–5.81)
RDW: 13.7 % (ref 11.5–15.5)
WBC: 8 10*3/uL (ref 4.0–10.5)
nRBC: 0 % (ref 0.0–0.2)

## 2021-01-24 NOTE — Progress Notes (Signed)
Subjective:    Patient ID: Eric Faulkner, male    DOB: [birth year], 41 y.o.   MRN: [id number] Chief Complaint  Patient presents with   Consult   HPI The patient is a 41 year old former smoker (10 PY) who presents for evaluation of dyspnea worse on supine position for the last 4-1/2 months.  Patient is kindly referred by Dr. Kerby Nora.  The patient states that he notes that shortness of breath mostly when he lays flat or bends over.  It is relieved by sleeping in reclining rather than fully supine position.  He does not notice the dyspnea as long as he is standing up or sitting upright.  The patient does not endorse any trauma to the chest, no neurological deficit, he does endorse some ill characterized left-sided chest pain/discomfort that has occurred since he developed shortness of breath.  For this reason he has undergone cardiac evaluation which has been so far negative.  With regards to trauma the only thought the patient has is that he had difficulties with neck pain and he sought chiropractic services and had "neck manipulation" in 5 different sessions the last of which was in September.  He recalls and able to lay flat on the chiropractor's table without difficulty.  After his last manipulation September he noted development of shortness of breath on laying supine.  He has had issues with reflux and is currently being treated with Protonix which has helped the symptoms but has not made any change on his shortness of breath.  He has had no fevers, chills or sweats.  No other symptomatology.  No weight loss or anorexia.  No cough or sputum production.  He has not had any recent viral illnesses.  He did have COVID-19 in 2020 without requirement of hospitalization.  No sequela.  He does endorse issues with seasonal allergies.  Review of his prior blood work does show that he has persistent eosinophilia.  No history of asthma.  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  He did  serve in [employer] for 3 years stationed in [place] no overseas deployments, no combat experience.  He lives alone, no pets.    Review of Systems A 10 point review of systems was performed and it is as noted above otherwise negative.  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 No pertinent surgical history.  Family History  Problem Relation Age of Onset   Cancer Mother 22       breast   ALS Paternal Uncle    Cancer Maternal Grandfather        throat   Social History   Tobacco Use   Smoking status: Former    Packs/day: 0.50    Years: 20.00    Pack years: 10.00    Types: Cigars, Cigarettes    Quit date: 02/29/2020    Years since quitting: 0.9   Smokeless tobacco: Never  Substance Use Topics   Alcohol use: Yes    Comment: Was drinking a pint of liquor every other day for the past 8 months but as of Saturday, 12/17/20  is going to stop drinking so much.   No Known Allergies  Current Meds  Medication Sig   doxycycline (VIBRA-TABS) 100 MG tablet TAKE 1 TABLET BY MOUTH EVERY DAY   lithium carbonate (LITHOBID) 300 MG CR tablet Take 900 mg by mouth at bedtime.   pantoprazole (PROTONIX) 40 MG tablet Take 1 tablet (40 mg total) by mouth daily.   QUEtiapine (SEROQUEL) 50 MG tablet Take 50 mg by mouth at bedtime.   [DISCONTINUED] metoprolol tartrate (LOPRESSOR) 100 MG tablet Take 1 tablet (100 mg total) by mouth once for 1 dose. Take 2 hours before your Cardiac CTA procedure   Immunization History  Administered Date(s) Administered   PFIZER(Purple Top)SARS-COV-2 Vaccination 10/22/2019,  11/01/2019, 01/26/2020   Tdap 07/11/2015      Objective:   Physical Exam BP 138/90 (BP Location: Left Arm, Patient Position: Sitting, Cuff Size: Normal)    Pulse 85    Temp 99.1 F (37.3 C) (Oral)    Ht 6\' 4"  (1.93 m)    Wt 274 lb 9.6 oz (124.6 kg)    SpO2 99%    BMI 33.43 kg/m  GENERAL: Well-nourished, well-developed gentleman, no acute distress.  Fully ambulatory.  No conversational dyspnea, articulate. HEAD: Normocephalic, atraumatic.  EYES: Pupils equal, round, reactive to light.  No scleral icterus.  MOUTH: Nose/mouth/throat not examined due to masking requirements for COVID 19. NECK: Supple. No thyromegaly. Trachea midline. No JVD.  No adenopathy. PULMONARY: Good air entry bilaterally.  No adventitious sounds. CARDIOVASCULAR: S1 and S2. Regular rate and rhythm.  No rubs, murmurs or gallops heard. ABDOMEN: Benign. MUSCULOSKELETAL: No joint deformity, no clubbing, no edema.  NEUROLOGIC: No focal deficit, no gait disturbance, speech is fluent. SKIN: Intact,warm,dry. PSYCH: Mood and behavior normal.  Recent Results (from the past 2160 hour(s))  HSV(herpes simplex vrs) 1+2 ab-IgG     Status: None   Collection Time: 12/12/20 10:57 AM  Result Value Ref Range   HAV 1 IGG,TYPE SPECIFIC AB <0.90 index   HSV 2 IGG,TYPE SPECIFIC AB <0.90 index    Comment:                           Index          Interpretation                           -----          --------------                           <0.90          Negative                           0.90-1.09      Equivocal                           >1.09          Positive . This assay utilizes recombinant type-specific antigens to differentiate HSV-1 from HSV-2 infections. A positive result cannot distinguish between recent and past infection. If recent HSV infection is suspected but the results are negative or equivocal, the assay should be repeated in 4-6 weeks. The performance characteristics of the assay have not been established for  pediatric populations, immunocompromised patients, or neonatal screening. . . For  additional information, please refer to  http://education.[facility].com/faq/FAQ118  (This link is being provided for informational/ educational purposes only.) .   Hepatitis panel, acute     Status: None   Collection Time: 12/12/20 10:57 AM  Result Value Ref Range   Hep A IgM NON-REACTIVE NON-REACTIVE   Hepatitis B Surface Ag NON-REACTIVE NON-REACTIVE   Hep B C IgM NON-REACTIVE NON-REACTIVE   Hepatitis C Ab NON-REACTIVE NON-REACTIVE   SIGNAL TO CUT-OFF <0.02 <1.00    Comment: . HCV antibody was non-reactive. There is no laboratory  evidence of HCV infection. . In most cases, no further action is required. However, if recent HCV exposure is suspected, a test for HCV RNA (test code 50932) is suggested. . For additional information please refer to http://education.[facility].com/faq/FAQ22v1 (This link is being provided for informational/ educational purposes only.) . Marland Kitchen For additional information, please refer to  http://education.[facility].com/faq/FAQ202  (This link is being provided for informational/ educational purposes only.) .   RPR     Status: None   Collection Time: 12/12/20 10:57 AM  Result Value Ref Range   RPR Ser Ql NON-REACTIVE NON-REACTIVE  HIV Antibody (routine testing w rflx)     Status: None   Collection Time: 12/12/20 10:57 AM  Result Value Ref Range   HIV 1&2 Ab, 4th Generation NON-REACTIVE NON-REACTIVE    Comment: HIV-1 antigen and HIV-1/HIV-2 antibodies were not detected. There is no laboratory evidence of HIV infection. Marland Kitchen PLEASE NOTE: This information has been disclosed to you from records whose confidentiality may be protected by state law.  If your state requires such protection, then the state law prohibits you from making any further disclosure of the information without the specific written consent of the person to whom it pertains, or as  otherwise permitted by law. A general authorization for the release of medical or other information is NOT sufficient for this purpose. . For additional information please refer to http://education.[facility].com/faq/FAQ106 (This link is being provided for informational/ educational purposes only.) . Marland Kitchen The performance of this assay has not been clinically validated in patients less than 14 years old. Salena Saner trachomatis/N. gonorrhoeae RNA     Status: None   Collection Time: 12/12/20 10:59 AM   Specimen: GU  Result Value Ref Range   C. trachomatis RNA, TMA NOT DETECTED NOT DETECTED   N. gonorrhoeae RNA, TMA NOT DETECTED NOT DETECTED    Comment: The analytical performance characteristics of this assay, when used to test SurePath(TM) specimens have been determined by [employer]. The modifications have not been cleared or approved by the FDA. This assay has been validated pursuant to the CLIA regulations and is used for clinical purposes. . For additional information, please refer to https://education.[facility].com/faq/FAQ154 (This link is being provided for information/ educational purposes only.) .   CBC with Differential/Platelet     Status: Abnormal   Collection Time: 12/15/20  1:02 PM  Result Value Ref Range   WBC 7.8 4.0 - 10.5 K/uL   RBC 5.24 4.22 - 5.81 Mil/uL   Hemoglobin 14.4 13.0 - 17.0 g/dL   HCT 67.1 24.5 - 80.9 %   MCV 81.9 78.0 - 100.0 fl   MCHC 33.4 30.0 - 36.0 g/dL   RDW 98.3 38.2 - 50.5 %   Platelets 247.0 150.0 - 400.0 K/uL   Neutrophils Relative % 58.4 43.0 - 77.0 %   Lymphocytes Relative 27.3 12.0 - 46.0 %   Monocytes Relative 8.4 3.0 - 12.0 %   Eosinophils Relative 5.2 (H) 0.0 -  5.0 %   Basophils Relative 0.7 0.0 - 3.0 %   Neutro Abs 4.5 1.4 - 7.7 K/uL   Lymphs Abs 2.1 0.7 - 4.0 K/uL   Monocytes Absolute 0.7 0.1 - 1.0 K/uL   Eosinophils Absolute 0.4 0.0 - 0.7 K/uL   Basophils Absolute 0.1 0.0 - 0.1 K/uL  TSH     Status: None    Collection Time: 12/15/20  1:02 PM  Result Value Ref Range   TSH 1.55 0.35 - 5.50 uIU/mL  Comprehensive metabolic panel     Status: None   Collection Time: 12/15/20  1:02 PM  Result Value Ref Range   Sodium 141 135 - 145 mEq/L   Potassium 3.9 3.5 - 5.1 mEq/L   Chloride 105 96 - 112 mEq/L   CO2 29 19 - 32 mEq/L   Glucose, Bld 83 70 - 99 mg/dL   BUN 15 6 - 23 mg/dL   Creatinine, Ser 1.611.20 0.40 - 1.50 mg/dL   Total Bilirubin 0.9 0.2 - 1.2 mg/dL   Alkaline Phosphatase 57 39 - 117 U/L   AST 14 0 - 37 U/L   ALT 19 0 - 53 U/L   Total Protein 7.1 6.0 - 8.3 g/dL   Albumin 4.3 3.5 - 5.2 g/dL   GFR 09.6074.84 >45.40>60.00 mL/min    Comment: Calculated using the CKD-EPI Creatinine Equation (2021)   Calcium 9.3 8.4 - 10.5 mg/dL   Comparison of chest x-ray performed 10/31/2019 (left) to chest x-ray performed 12/05/2020 (right):     There is right hemidiaphragmatic elevation on the 12/05/2020 film.  12/13/2020 sniff test showed decreased diaphragmatic motion on inspiration expiration and sniffing but decreased compared to the contralateral side.  Left hemidiaphragm motion was normal.    Assessment & Plan:     ICD-10-CM   1. 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 SOB (shortness of breath)  R06.02 CBC w/Diff    Allergen Panel (27) + IGE    Pulmonary Function Test [facility] Only   Suspect related to phrenic nerve palsy PFTs Vital capacity supine and upright    3. Eosinophilia, unspecified type  D72.10    CBC with differential Allergen panel     Orders Placed This Encounter  Procedures   CT CHEST ABDOMEN PELVIS W CONTRAST    Standing Status:   Future    Standing Expiration Date:   01/24/2022    Order Specific Question:   Preferred imaging location?    Answer:   [facility]    Order Specific Question:   Is Oral  Contrast requested for this exam?    Answer:   Yes, Per Radiology protocol   CBC w/Diff    Standing Status:   Future    Number of Occurrences:   1    Standing Expiration Date:   01/24/2022   Allergen Panel (27) + IGE    Standing Status:   Future    Number of Occurrences:   1    Standing Expiration Date:   01/24/2022   Pulmonary Function Test [facility] Only    Please check Vital Capacity  Supine and Sitting    Standing Status:   Future    Standing Expiration Date:   01/24/2022    Order Specific Question:   Full PFT: includes the following: basic spirometry, spirometry pre & post bronchodilator, diffusion capacity (DLCO),  lung volumes    Answer:   Full PFT    Order Specific Question:   This test can only be performed at    Answer:   [facility]   I discussed findings of the x-ray films with the patient.  Discussed the potential etiologies of phrenic nerve palsy.  Given that he has partial function on the right I am hopeful that he will eventually recover function.  This however is at this point uncertain.  We will notify the patient of the results of the tests requested above as they become available.  We will see the patient in follow-up in 4 to 6 weeks time he is to contact us prior to that time should any new difficulties arise.  Gailen Shelter. Laura Tarik Teixeira, MD Advanced Bronchoscopy PCCM [facility]    *This note was dictated using voice recognition software/Dragon.  Despite best efforts to proofread, errors can occur which can change the meaning. Any transcriptional errors that result from this process are unintentional and may not be fully corrected at the time of dictation.

## 2021-01-24 NOTE — Patient Instructions (Signed)
We are going to get a more detailed scan of your chest and abdomen to see if there is any structural issue causing the partial paralysis of your right diaphragm.  We are going to do a breathing test to check your breathing capacity.  We are also checking a blood test to evaluate for allergies.  We will call you with the results of all these tests as they come back.  We will see you in follow-up in 4 to 6 weeks time call sooner should any new difficulties arise.

## 2021-01-27 LAB — ALLERGEN PANEL (27) + IGE
Alternaria Alternata IgE: 0.84 kU/L — AB
Aspergillus Fumigatus IgE: 0.18 kU/L — AB
Bahia Grass IgE: 0.99 kU/L — AB
Bermuda Grass IgE: 0.34 kU/L — AB
Cat Dander IgE: <0.1 kU/L
Cedar, Mountain IgE: <0.1 kU/L
Cladosporium Herbarum IgE: 0.18 kU/L — AB
Cocklebur IgE: <0.1 kU/L
Cockroach, American IgE: <0.1 kU/L
Common Silver Birch IgE: <0.1 kU/L
D Farinae IgE: 1.18 kU/L — AB
D Pteronyssinus IgE: 0.79 kU/L — AB
Dog Dander IgE: <0.1 kU/L
Elm, American IgE: <0.1 kU/L
Hickory, White IgE: 0.13 kU/L — AB
IgE (Immunoglobulin E), Serum: 20 IU/mL (ref 6–495)
Johnson Grass IgE: 0.51 kU/L — AB
Kentucky Bluegrass IgE: 1.69 kU/L — AB
Maple/Box Elder IgE: <0.1 kU/L
Mucor Racemosus IgE: <0.1 kU/L
Oak, White IgE: <0.1 kU/L
Penicillium Chrysogen IgE: <0.1 kU/L
Pigweed, Rough IgE: <0.1 kU/L
Plantain, English IgE: 0.1 kU/L — AB
Ragweed, Short IgE: <0.1 kU/L
Setomelanomma Rostrat: 0.41 kU/L — AB
Timothy Grass IgE: 1.39 kU/L — AB
White Mulberry IgE: <0.1 kU/L

## 2021-01-29 ENCOUNTER — Telehealth: Payer: Self-pay

## 2021-01-29 NOTE — Telephone Encounter (Signed)
Pt aware of upcoming covid test, nothing further needed.  

## 2021-01-31 ENCOUNTER — Other Ambulatory Visit: Payer: Self-pay

## 2021-01-31 ENCOUNTER — Other Ambulatory Visit
Admission: RE | Admit: 2021-01-31 | Discharge: 2021-01-31 | Disposition: A | Discharge status: Home / Self Care | Payer: Federal, State, Local not specified - PPO | Source: Ambulatory Visit | Attending: Pulmonary Disease | Admitting: Pulmonary Disease

## 2021-01-31 DIAGNOSIS — Z01812 Encounter for preprocedural laboratory examination: Secondary | ICD-10-CM | POA: Diagnosis not present

## 2021-01-31 DIAGNOSIS — Z20822 Contact with and (suspected) exposure to covid-19: Secondary | ICD-10-CM | POA: Diagnosis not present

## 2021-01-31 LAB — SARS CORONAVIRUS 2 (TAT 6-24 HRS): SARS Coronavirus 2: NEGATIVE

## 2021-02-01 ENCOUNTER — Ambulatory Visit: Payer: Federal, State, Local not specified - PPO

## 2021-02-02 ENCOUNTER — Other Ambulatory Visit: Payer: Self-pay

## 2021-02-02 ENCOUNTER — Ambulatory Visit: Payer: Federal, State, Local not specified - PPO | Attending: Pulmonary Disease

## 2021-02-02 DIAGNOSIS — R0600 Dyspnea, unspecified: Secondary | ICD-10-CM | POA: Insufficient documentation

## 2021-02-02 DIAGNOSIS — R0602 Shortness of breath: Secondary | ICD-10-CM

## 2021-02-02 DIAGNOSIS — Z87891 Personal history of nicotine dependence: Secondary | ICD-10-CM | POA: Insufficient documentation

## 2021-02-02 DIAGNOSIS — J984 Other disorders of lung: Secondary | ICD-10-CM | POA: Insufficient documentation

## 2021-02-02 MED ORDER — ALBUTEROL SULFATE (2.5 MG/3ML) 0.083% IN NEBU
2.5000 mg | INHALATION_SOLUTION | Freq: Once | RESPIRATORY_TRACT | Status: AC
  Administered 2021-02-02: 2.5 mg via RESPIRATORY_TRACT
  Filled 2021-02-02: qty 3

## 2021-02-05 LAB — PULMONARY FUNCTION TEST ARMC ONLY
DL/VA % pred: 123 %
DL/VA: 5.57 ml/min/mmHg/L
DLCO unc % pred: 71 %
DLCO unc: 26.19 ml/min/mmHg
FEF 25-75 Post: 3.04 L/sec
FEF 25-75 Pre: 3.17 L/sec
FEF2575-%Change-Post: -4 %
FEF2575-%Pred-Post: 71 %
FEF2575-%Pred-Pre: 74 %
FEV1-%Change-Post: 0 %
FEV1-%Pred-Post: 64 %
FEV1-%Pred-Pre: 64 %
FEV1-Post: 2.76 L
FEV1-Pre: 2.76 L
FEV1FVC-%Change-Post: 2 %
FEV1FVC-%Pred-Pre: 101 %
FEV6-%Change-Post: -2 %
FEV6-%Pred-Post: 62 %
FEV6-%Pred-Pre: 64 %
FEV6-Post: 3.25 L
FEV6-Pre: 3.33 L
FEV6FVC-%Change-Post: 0 %
FEV6FVC-%Pred-Post: 102 %
FEV6FVC-%Pred-Pre: 101 %
FVC-%Change-Post: -2 %
FVC-%Pred-Post: 61 %
FVC-%Pred-Pre: 62 %
FVC-Post: 3.25 L
FVC-Pre: 3.33 L
Post FEV1/FVC ratio: 85 %
Post FEV6/FVC ratio: 100 %
Pre FEV1/FVC ratio: 83 %
Pre FEV6/FVC Ratio: 100 %
RV % pred: 61 %
RV: 1.36 L
TLC % pred: 56 %
TLC: 4.58 L

## 2021-02-08 DIAGNOSIS — F1010 Alcohol abuse, uncomplicated: Secondary | ICD-10-CM | POA: Insufficient documentation

## 2021-02-08 DIAGNOSIS — R002 Palpitations: Secondary | ICD-10-CM | POA: Insufficient documentation

## 2021-02-08 DIAGNOSIS — J986 Disorders of diaphragm: Secondary | ICD-10-CM | POA: Insufficient documentation

## 2021-02-08 NOTE — Assessment & Plan Note (Signed)
Likely multifactorial.  In part due to hemidiaphragm dysfunction as well as inactivity and deconditioning. Eval with labs for secondary cause.

## 2021-02-08 NOTE — Assessment & Plan Note (Signed)
Eval for secondary cause.  Discussed excessive alcohol use likely contributing. Stay away from caffeine, decongestant.  Good job quitting smoking.  Decrease alcohol intake over time.. Recommended limit no more than 2 alcoholic beverages at a time.  Refer to cardiology for [facility] monitor.   Go to ER if severe shortness of breath or chest pain.

## 2021-02-08 NOTE — Assessment & Plan Note (Signed)
UNclear cause.. refer to Pulmonary for further recommendations.

## 2021-02-09 ENCOUNTER — Other Ambulatory Visit: Payer: Self-pay | Admitting: Family Medicine

## 2021-02-09 ENCOUNTER — Ambulatory Visit
Admission: RE | Admit: 2021-02-09 | Discharge: 2021-02-09 | Disposition: A | Discharge status: Home / Self Care | Payer: Federal, State, Local not specified - PPO | Source: Ambulatory Visit | Attending: Pulmonary Disease | Admitting: Pulmonary Disease

## 2021-02-09 ENCOUNTER — Other Ambulatory Visit: Payer: Self-pay

## 2021-02-09 DIAGNOSIS — K7689 Other specified diseases of liver: Secondary | ICD-10-CM | POA: Diagnosis not present

## 2021-02-09 DIAGNOSIS — J984 Other disorders of lung: Secondary | ICD-10-CM | POA: Diagnosis not present

## 2021-02-09 DIAGNOSIS — J9811 Atelectasis: Secondary | ICD-10-CM | POA: Diagnosis not present

## 2021-02-09 DIAGNOSIS — Q438 Other specified congenital malformations of intestine: Secondary | ICD-10-CM | POA: Diagnosis not present

## 2021-02-09 DIAGNOSIS — J986 Disorders of diaphragm: Secondary | ICD-10-CM | POA: Insufficient documentation

## 2021-02-09 DIAGNOSIS — N281 Cyst of kidney, acquired: Secondary | ICD-10-CM | POA: Diagnosis not present

## 2021-02-09 MED ORDER — IOHEXOL 300 MG/ML  SOLN
100.0000 mL | Freq: Once | INTRAMUSCULAR | Status: AC | PRN
  Administered 2021-02-09: 100 mL via INTRAVENOUS

## 2021-02-12 ENCOUNTER — Other Ambulatory Visit: Payer: Self-pay | Admitting: Family Medicine

## 2021-02-12 NOTE — Telephone Encounter (Signed)
Left message for Eric Faulkner to return my call in regards to his acid reflux medication.

## 2021-02-12 NOTE — Telephone Encounter (Signed)
Received note from pharmacy stating MAX 90 PER 365 DAYS.  Is patient suppose to continue Pantoprazole?  If so, I can attempt a PA.  Please advise.

## 2021-02-13 ENCOUNTER — Encounter: Payer: Self-pay | Admitting: *Deleted

## 2021-02-13 NOTE — Telephone Encounter (Signed)
Left message for Eric Faulkner to return my call in regards to his acid reflux medication.  I also advised him that I would send him a MyChart message in regards to this so he can return my call or read the message on his MyChart.

## 2021-02-13 NOTE — Telephone Encounter (Signed)
Eric Faulkner replied back to my MyChart message:  I can go to the OTC if needed, right not not experiencing anymore reflux.

## 2021-02-21 ENCOUNTER — Encounter: Payer: Self-pay | Admitting: Pulmonary Disease

## 2021-02-21 ENCOUNTER — Encounter: Payer: Self-pay | Admitting: Family Medicine

## 2021-02-21 NOTE — Telephone Encounter (Signed)
There is a possibility of a phrenic nerve EMG but is usually done in specialized places.  What we could do is refer him to neurology to evaluate him and see what studies can be done.  However the only thing that this would tell us is that the phrenic nerve has been injured which is suggested by the studies he has done.  It would not tell us what is injuring it.  But we can refer him to neurology to give [place] their opinion.

## 2021-02-21 NOTE — Telephone Encounter (Signed)
Dr. Gonzalez, please advise. Thanks 

## 2021-02-27 ENCOUNTER — Encounter: Payer: Self-pay | Admitting: Pulmonary Disease

## 2021-02-27 ENCOUNTER — Other Ambulatory Visit: Payer: Self-pay

## 2021-02-27 ENCOUNTER — Ambulatory Visit: Payer: Federal, State, Local not specified - PPO | Admitting: Pulmonary Disease

## 2021-02-27 VITALS — BP 122/78 | HR 104 | Temp 98.4°F | Ht 76.0 in | Wt 276.9 lb

## 2021-02-27 DIAGNOSIS — J986 Disorders of diaphragm: Secondary | ICD-10-CM

## 2021-02-27 DIAGNOSIS — G4730 Sleep apnea, unspecified: Secondary | ICD-10-CM

## 2021-02-27 DIAGNOSIS — R0602 Shortness of breath: Secondary | ICD-10-CM | POA: Diagnosis not present

## 2021-02-27 NOTE — Patient Instructions (Signed)
We have made a referral to neurology to further evaluate for potential impingement of nerves in the neck.  This may help elucidate what is going on with your diaphragm.  We have also ordered the split night study.  Depending on what is found by the neurologist there are a few things that can be tried.  We will see you in follow-up in 4 to 6 weeks time call sooner should any new problems arise.

## 2021-02-27 NOTE — Progress Notes (Signed)
Subjective:    Patient ID: Eric Faulkner, male    DOB: 03/19/1980, 41 y.o.   MRN: [id number] Patient Care Team: Excell Seltzer, MD as PCP - General (Family Medicine)  Chief Complaint  Patient presents with   Follow-up   HPI The patient is a 41 year old former smoker (10 PY) who presents for follow-up of dyspnea worse on supine position for the last 4-1/2 months.  Patient was initially evaluated here on 24 January 2021 for the details of that visit please refer to that note.  The patient continues to have shortness of breath mostly when he lays flat or bends over.  It is relieved by sleeping in reclining rather than fully supine position.  He does not notice the dyspnea as long as he is standing up or sitting upright.  The patient does not endorse any trauma to the chest, no neurological deficit.  No history of trauma, no recent viral illnesses.  Previously the patient was having difficulties with right-sided shoulder and neck pain and sought the help of chiropractic services and had "neck manipulation" in 5 different sessions the last of which was in September.  He recalls that he was able to lay flat on the chiropractor's table without difficulty.  After his last manipulation September he noted development of shortness of breath on laying supine. He has had no fevers, chills or sweats.  No other symptomatology.  No weight loss or anorexia.  No cough or sputum production.  He has to sleep in recliner due to shortness of breath that occurs when he is supine.  DATA 12/05/2020 chest x-ray: Significant elevation of the right hemidiaphragm 12/13/2020 sniff test: Normal left diaphragmatic motion, decreased overall right diaphragmatic motion compared to left but not complete paralysis 12/28/2020 CT coronary: Normal coronaries with calcium score of 0. 02/02/2021 PFTs: Moderate restriction, no obstruction, no bronchodilator response.  There is significant decrease in slow vital capacity when compared  to sitting versus supine positions.  This is consistent with diaphragmatic dysfunction.  Diffusion capacity normal when corrected for Kco 02/09/2021 CT chest abdomen and pelvis: Elevation of the right hemidiaphragm, normal left hemidiaphragm.  Atelectasis of the right lower lobe associated with elevation of the right hemidiaphragm.  No focal lesions noted, no explanation for diaphragm elevation.  Normal mediastinal structures  Review of Systems A 10 point review of systems was performed and it is as noted above otherwise negative.  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/14/2020   Hypotension 07/14/2020   Sebaceous cyst of axilla 11/12/2019   Gastroesophageal reflux disease without esophagitis 11/12/2019   Bipolar affective disorder (HCC) 08/06/2018   Seasonal allergies 06/30/2014   Social History   Tobacco Use   Smoking status: Former    Packs/day: 0.50    Years: 20.00    Pack years: 10.00    Types: Cigars, Cigarettes    Quit date: 02/29/2020    Years since quitting: 0.9   Smokeless tobacco: Never  Substance Use Topics   Alcohol use: Yes    Comment: Was drinking a pint of liquor every other day for the past 8 months but as of Saturday, 12/17/20 is going to stop drinking so much.   Current Meds  Medication Sig   lithium carbonate (LITHOBID) 300 MG CR tablet Take 900 mg by mouth at bedtime.  pantoprazole (PROTONIX) 40 MG tablet TAKE 1 TABLET BY MOUTH EVERY DAY   QUEtiapine (SEROQUEL) 50 MG tablet Take 50 mg by mouth at bedtime.   [DISCONTINUED] doxycycline (VIBRA-TABS) 100 MG tablet TAKE 1 TABLET BY MOUTH EVERY DAY   Immunization History  Administered Date(s) Administered   PFIZER(Purple Top)SARS-COV-2 Vaccination 10/22/2019, 11/01/2019, 01/26/2020   Tdap 07/11/2015        Objective:   Physical Exam BP 122/78 (BP Location: Left Arm, Patient Position: Sitting, Cuff Size: Normal)    Pulse (!) 104    Temp 98.4 F (36.9 C) (Oral)    Ht 6\' 4"  (1.93 m)    Wt 276 lb 14.4 oz (125.6 kg)    SpO2 98%    BMI 33.71 kg/m  GENERAL: Well-nourished, well-developed gentleman, no acute distress.  Fully ambulatory.  No conversational dyspnea, articulate. HEAD: Normocephalic, atraumatic.  EYES: Pupils equal, round, reactive to light.  No scleral icterus.  MOUTH: Nose/mouth/throat not examined due to masking requirements for COVID 19. NECK: Supple. No thyromegaly. Trachea midline. No JVD.  No adenopathy. PULMONARY: Good air entry bilaterally.  Diminished breath sounds on the right base.  CARDIOVASCULAR: S1 and S2. Regular rate and rhythm.  No rubs, murmurs or gallops heard. ABDOMEN: Benign. MUSCULOSKELETAL: No joint deformity, no clubbing, no edema.  NEUROLOGIC: No focal deficit, no gait disturbance, speech is fluent. SKIN: Intact,warm,dry. PSYCH: Mood and behavior normal.      Assessment & Plan:     ICD-10-CM   1. Elevated hemidiaphragm - RIGHT  J98.6 Ambulatory referral to Neurology   May be related to C-spine disease Refer to neurology per patient's request    2. Sleep-disordered breathing  G47.30 Split night study   Sleep study CPAP/BiPAP may help with the patient's dyspnea    3. SOB (shortness of breath)  R06.02    Related to right hemidiaphragm elevation      Orders Placed This Encounter  Procedures   Ambulatory referral to Neurology    Referral Priority:   Routine    Referral Type:   Consultation    Referral Reason:   Specialty Services Required    Referred to Provider:   , MD    Requested Specialty:   Neurology    Number of Visits Requested:   1   Split night study    Standing Status:   Future    Standing Expiration Date:   02/27/2022    Order Specific Question:   Where should this test be performed:    Answer:   [facility]    The  patient has right hemidiaphragm elevation which may be aggravating sleep disordered breathing.  We will proceed with a split-night study to see if patient can qualify for CPAP/BiPAP which may help with his issues with dyspnea during the day as well.  Patient is concerned about needing further neurologic evaluation for his elevated right hemidiaphragm.  We will proceed with referral to neurology as per his request.  He may indeed have some issues with C-spine disease.   We will see the patient in follow-up in 2 weeks time he is to contact 03/01/2022 prior to that time should any new difficulties arise   C. [place], MD Advanced Bronchoscopy PCCM [facility]    *This note was dictated using voice recognition software/Dragon.  Despite best efforts to proofread, errors can occur which can change the meaning. Any transcriptional errors that result from this process are unintentional and may not be fully corrected at the time  of dictation.

## 2021-03-06 DIAGNOSIS — L732 Hidradenitis suppurativa: Secondary | ICD-10-CM | POA: Diagnosis not present

## 2021-03-09 ENCOUNTER — Telehealth: Payer: Self-pay | Admitting: Pulmonary Disease

## 2021-03-09 DIAGNOSIS — J986 Disorders of diaphragm: Secondary | ICD-10-CM

## 2021-03-09 NOTE — Telephone Encounter (Signed)
Referred to [facility] neurology for guidance.

## 2021-03-09 NOTE — Telephone Encounter (Signed)
Dr. Gonzalez, please advise. Thanks 

## 2021-03-12 DIAGNOSIS — F3189 Other bipolar disorder: Secondary | ICD-10-CM | POA: Diagnosis not present

## 2021-03-12 NOTE — Telephone Encounter (Signed)
Referral placed to [facility] Neurology. Patient is aware and voiced his understanding.  Nothing further needed.

## 2021-03-20 DIAGNOSIS — J986 Disorders of diaphragm: Secondary | ICD-10-CM | POA: Diagnosis not present

## 2021-03-20 DIAGNOSIS — T148XXA Other injury of unspecified body region, initial encounter: Secondary | ICD-10-CM | POA: Diagnosis not present

## 2021-03-20 DIAGNOSIS — E569 Vitamin deficiency, unspecified: Secondary | ICD-10-CM | POA: Diagnosis not present

## 2021-03-20 DIAGNOSIS — Z131 Encounter for screening for diabetes mellitus: Secondary | ICD-10-CM | POA: Diagnosis not present

## 2021-03-20 DIAGNOSIS — F319 Bipolar disorder, unspecified: Secondary | ICD-10-CM | POA: Diagnosis not present

## 2021-03-21 ENCOUNTER — Other Ambulatory Visit: Payer: Self-pay | Admitting: Neurology

## 2021-03-21 DIAGNOSIS — T148XXA Other injury of unspecified body region, initial encounter: Secondary | ICD-10-CM

## 2021-03-27 ENCOUNTER — Ambulatory Visit: Admission: RE | Admit: 2021-03-27 | Payer: Federal, State, Local not specified - PPO | Source: Ambulatory Visit

## 2021-03-29 NOTE — Telephone Encounter (Signed)
So the referral initially was done for [facility] neurology.  Dr. Sherryll Burger then asked [place] to refer the patient to [facility].  However [facility] made the appointment with  Dr. Clelia Croft.  The patient has already been seen by Dr. Clelia Croft at [place], Dr. Clelia Croft is having [facility] do EMGs specific to the diaphragm.  This issue has already been taking care of.  There is no reason to refer him to GI. ?

## 2021-03-29 NOTE — Telephone Encounter (Signed)
Routing to Anita to make aware.  

## 2021-03-29 NOTE — Telephone Encounter (Signed)
Synetta Fail received call from [facility].  ?[facility] is stating that patient should be referred to GI for hemidiaphragm. ? ?Dr. Jayme Cloud, please advise. thanks  ?

## 2021-04-01 ENCOUNTER — Ambulatory Visit
Admission: RE | Admit: 2021-04-01 | Discharge: 2021-04-01 | Disposition: A | Discharge status: Home / Self Care | Payer: Federal, State, Local not specified - PPO | Source: Ambulatory Visit | Attending: Neurology | Admitting: Neurology

## 2021-04-01 ENCOUNTER — Other Ambulatory Visit: Payer: Self-pay

## 2021-04-01 DIAGNOSIS — M4802 Spinal stenosis, cervical region: Secondary | ICD-10-CM | POA: Diagnosis not present

## 2021-04-01 DIAGNOSIS — T148XXA Other injury of unspecified body region, initial encounter: Secondary | ICD-10-CM | POA: Insufficient documentation

## 2021-04-01 DIAGNOSIS — M542 Cervicalgia: Secondary | ICD-10-CM | POA: Diagnosis not present

## 2021-04-09 DIAGNOSIS — F1090 Alcohol use, unspecified, uncomplicated: Secondary | ICD-10-CM | POA: Diagnosis not present

## 2021-04-09 DIAGNOSIS — Z87891 Personal history of nicotine dependence: Secondary | ICD-10-CM | POA: Diagnosis not present

## 2021-04-09 DIAGNOSIS — F319 Bipolar disorder, unspecified: Secondary | ICD-10-CM | POA: Diagnosis not present

## 2021-04-12 DIAGNOSIS — R0602 Shortness of breath: Secondary | ICD-10-CM | POA: Diagnosis not present

## 2021-04-12 DIAGNOSIS — J986 Disorders of diaphragm: Secondary | ICD-10-CM | POA: Diagnosis not present

## 2021-04-13 ENCOUNTER — Ambulatory Visit (INDEPENDENT_AMBULATORY_CARE_PROVIDER_SITE_OTHER): Payer: Federal, State, Local not specified - PPO | Admitting: Pulmonary Disease

## 2021-04-13 ENCOUNTER — Encounter: Payer: Self-pay | Admitting: Pulmonary Disease

## 2021-04-13 VITALS — BP 116/76 | HR 82 | Temp 97.7°F | Ht 76.0 in | Wt 279.8 lb

## 2021-04-13 DIAGNOSIS — G4730 Sleep apnea, unspecified: Secondary | ICD-10-CM | POA: Diagnosis not present

## 2021-04-13 DIAGNOSIS — J986 Disorders of diaphragm: Secondary | ICD-10-CM | POA: Diagnosis not present

## 2021-04-13 DIAGNOSIS — M4802 Spinal stenosis, cervical region: Secondary | ICD-10-CM | POA: Diagnosis not present

## 2021-04-13 NOTE — Patient Instructions (Signed)
We have sent a referral to neurosurgery ? ?I will be on the look out for the results of the EMG performed at [facility]. ? ?I will see you in follow-up in 2 months time call sooner should any new problems arise. ?

## 2021-04-13 NOTE — Progress Notes (Signed)
Subjective:    Patient ID: Eric Faulkner, male    DOB: January 20, 1980, 41 y.o.   MRN: [id number] Patient Care Team: Excell Seltzer, MD as PCP - General (Family Medicine)  Chief Complaint  Patient presents with   Follow-up    HPI The patient is a 41 year old former smoker (10 PY) who presents for follow-up of dyspnea worse on supine position.  He was last seen on 27 February 2021 at his request he was referred to neurology for diaphragm specific EMGs.   The patient continues to have shortness of breath mostly when he lays flat or bends over.  It is relieved by sleeping in reclining rather than fully supine position.  He does not notice the dyspnea as long as he is standing up or sitting upright.  The patient does not endorse any trauma to the chest, no neurological deficit.  No history of trauma, no recent viral illnesses.  Previously the patient was having difficulties with right-sided shoulder and neck pain and sought the help of chiropractic services and had "neck manipulation" in 5 different sessions the last of which was in September.  He recalls that he was able to lay flat on the chiropractor's table without difficulty.  After his last manipulation September he noted development of shortness of breath on laying supine. He has had no fevers, chills or sweats.  No other symptomatology.  No weight loss or anorexia.  No cough or sputum production. He has to sleep in recliner due to shortness of breath that occurs when he is supine.  At his last visit he requested referral to neurology this was done and evaluated by Dr. Cristopher Peru at [place] he was referred to [facility] for EMGs to evaluate left phrenic nerve motor and right phrenic nerve motor response.  He had this done yesterday.  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  He had a cervical MRI performed on 19 March that showed moderate to severe foraminal stenosis in the cervical region.   See findings as below.  Discussed these results with the patient today.  Patient does not endorse any other new symptomatology.  DATA 12/05/2020 chest x-ray: Significant elevation of the right hemidiaphragm 12/13/2020 sniff test: Normal left diaphragmatic motion, decreased overall right diaphragmatic motion compared to left but not complete paralysis 12/28/2020 CT coronary: Normal coronaries with calcium score of 0. 02/02/2021 PFTs: Moderate restriction, no obstruction, no bronchodilator response.  There is significant decrease in slow vital capacity when compared to sitting versus supine positions.  This is consistent with diaphragmatic dysfunction.  Diffusion capacity normal when corrected for Kco 02/09/2021 CT chest abdomen and pelvis: Elevation of the right hemidiaphragm, normal left hemidiaphragm. Atelectasis of the right lower lobe associated with elevation of the right hemidiaphragm.  No focal lesions noted, no explanation for diaphragm elevation.  Normal mediastinal structures 04/01/2021 MRI cervical spine wo: Reversal of the normal cervical lordosis, congenital and acquired mild cervical spinal stenosis C2-C3 through C6-C7 mild associated spinal cord mass effect.  Moderate to severe bilateral C4 neural foraminal stenosis greater on the left.  Up to moderate foraminal stenosis at the left C6 and bilateral C7 nerve levels.  Review of Systems A 10 point review of systems was performed and it is as noted above otherwise negative.  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   Hypotension 07/14/2020   Sebaceous cyst of axilla 11/12/2019   Gastroesophageal reflux disease without esophagitis 11/12/2019   Bipolar affective disorder (HCC) 08/06/2018   Seasonal allergies 06/30/2014   Social  History   Tobacco Use   Smoking status: Former    Packs/day: 0.50    Years: 20.00    Pack years: 10.00    Types: Cigars, Cigarettes    Quit date: 02/29/2020    Years since quitting: 1.3   Smokeless tobacco: Never  Substance Use Topics   Alcohol use: Yes    Comment: Was drinking a pint of liquor every other day for the past 8 months but as of Saturday, 12/17/20 is going to stop drinking so much.   No Known Allergies  Current Meds  Medication Sig   lithium carbonate (LITHOBID) 300 MG CR tablet Take 900 mg by mouth at bedtime.   QUEtiapine (SEROQUEL) 50 MG tablet Take 50 mg by mouth at bedtime.   Immunization History  Administered Date(s) Administered   PFIZER(Purple Top)SARS-COV-2 Vaccination 10/22/2019, 11/01/2019, 01/26/2020   Tdap 07/11/2015      Objective:   Physical Exam BP 116/76 (BP Location: Left Arm, Patient Position: Sitting, Cuff Size: Normal)   Pulse 82   Temp 97.7 F (36.5 C) (Oral)   Ht 6\' 4"  (1.93 m)   Wt 279 lb 12.8 oz (126.9 kg)   SpO2 100%   BMI 34.06 kg/m  GENERAL: Well-nourished, well-developed gentleman, no acute distress.  Fully ambulatory.  No conversational dyspnea, articulate. HEAD: Normocephalic, atraumatic.  EYES: Pupils equal, round, reactive to light.  No scleral icterus.  MOUTH: Nose/mouth/throat not examined due to masking requirements for COVID 19. NECK: Supple. No thyromegaly. Trachea midline. No JVD.  No adenopathy. PULMONARY: Good air entry bilaterally.  Diminished breath sounds on the right base.  CARDIOVASCULAR: S1 and S2. Regular rate and rhythm.  No rubs, murmurs or gallops heard. ABDOMEN: Benign. MUSCULOSKELETAL: No joint deformity, no clubbing, no edema.  NEUROLOGIC: No focal deficit, no gait disturbance, speech is fluent. SKIN: Intact,warm,dry. PSYCH: Mood and behavior normal.     Assessment & Plan:     ICD-10-CM   1. 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 Will request neurosurgical evaluation    2. 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 Sleep-disordered breathing  G47.30    Awaiting sleep study     Orders Placed This Encounter  Procedures   Ambulatory referral to Neurosurgery    Referral Priority:   Routine    Referral Type:   Surgical    Referral Reason:   Specialty Services Required    Referred to Provider:   Sherryll Burger, MD    Requested Specialty:   Neurosurgery    Number of Visits Requested:   1    Patient will be referred to neurosurgery for evaluation of his foraminal stenosis of the C-spine.  We will notify the patient of the formal results of the EMG will when available from [facility].  We will see the patient in follow-up in 2 months time he is to contact [place] prior to that time should any new problems arise.  [place], MD Advanced Bronchoscopy PCCM [place]    *This note was dictated using voice recognition software/Dragon.  Despite best efforts to proofread, errors can occur which can change the meaning. Any transcriptional errors that result from this process are unintentional and may not be fully corrected at the time of dictation.

## 2021-04-13 NOTE — Progress Notes (Signed)
[place]y  

## 2021-05-08 DIAGNOSIS — J986 Disorders of diaphragm: Secondary | ICD-10-CM | POA: Diagnosis not present

## 2021-05-08 DIAGNOSIS — F319 Bipolar disorder, unspecified: Secondary | ICD-10-CM | POA: Diagnosis not present

## 2021-05-08 DIAGNOSIS — T148XXA Other injury of unspecified body region, initial encounter: Secondary | ICD-10-CM | POA: Diagnosis not present

## 2021-05-22 ENCOUNTER — Ambulatory Visit: Payer: Federal, State, Local not specified - PPO | Attending: Pulmonary Disease

## 2021-05-22 DIAGNOSIS — F17290 Nicotine dependence, other tobacco product, uncomplicated: Secondary | ICD-10-CM | POA: Diagnosis not present

## 2021-05-22 DIAGNOSIS — R0609 Other forms of dyspnea: Secondary | ICD-10-CM | POA: Diagnosis not present

## 2021-05-22 DIAGNOSIS — G4733 Obstructive sleep apnea (adult) (pediatric): Secondary | ICD-10-CM | POA: Diagnosis not present

## 2021-05-22 DIAGNOSIS — R0602 Shortness of breath: Secondary | ICD-10-CM | POA: Diagnosis not present

## 2021-05-28 ENCOUNTER — Telehealth: Payer: Self-pay

## 2021-05-28 NOTE — Telephone Encounter (Signed)
Sleep study reviewed by Dr. Dione Plover study shows mild OSA. Recommend a cpap 5-15cm h2O. ?Spoke to patient and relayed above results. He stated that he would like to discuss results further at upcoming appt on6/05/2021. ?Nothing further needed.  ? ?

## 2021-06-04 DIAGNOSIS — L732 Hidradenitis suppurativa: Secondary | ICD-10-CM | POA: Diagnosis not present

## 2021-06-07 DIAGNOSIS — F3189 Other bipolar disorder: Secondary | ICD-10-CM | POA: Diagnosis not present

## 2021-06-12 ENCOUNTER — Encounter: Payer: Self-pay | Admitting: Pulmonary Disease

## 2021-06-12 DIAGNOSIS — M47812 Spondylosis without myelopathy or radiculopathy, cervical region: Secondary | ICD-10-CM | POA: Diagnosis not present

## 2021-06-12 DIAGNOSIS — Z79899 Other long term (current) drug therapy: Secondary | ICD-10-CM | POA: Diagnosis not present

## 2021-06-12 DIAGNOSIS — F3189 Other bipolar disorder: Secondary | ICD-10-CM | POA: Diagnosis not present

## 2021-06-12 DIAGNOSIS — G5680 Other specified mononeuropathies of unspecified upper limb: Secondary | ICD-10-CM | POA: Diagnosis not present

## 2021-06-12 DIAGNOSIS — R0602 Shortness of breath: Secondary | ICD-10-CM

## 2021-06-13 NOTE — Telephone Encounter (Signed)
A simple chest x-ray PA and lateral would suffice.  He has a follow-up with me on June 5 it can be done prior to the visit.

## 2021-06-13 NOTE — Telephone Encounter (Signed)
Dr. Gonzalez, please advise. Thanks 

## 2021-06-18 ENCOUNTER — Ambulatory Visit
Admission: RE | Admit: 2021-06-18 | Discharge: 2021-06-18 | Disposition: A | Discharge status: Home / Self Care | Payer: Federal, State, Local not specified - PPO | Attending: Pulmonary Disease | Admitting: Pulmonary Disease

## 2021-06-18 ENCOUNTER — Encounter: Payer: Self-pay | Admitting: Pulmonary Disease

## 2021-06-18 ENCOUNTER — Ambulatory Visit
Admission: RE | Admit: 2021-06-18 | Discharge: 2021-06-18 | Disposition: A | Discharge status: Home / Self Care | Payer: Federal, State, Local not specified - PPO | Source: Ambulatory Visit | Attending: Pulmonary Disease | Admitting: Pulmonary Disease

## 2021-06-18 ENCOUNTER — Ambulatory Visit: Payer: Federal, State, Local not specified - PPO | Admitting: Pulmonary Disease

## 2021-06-18 VITALS — BP 120/82 | HR 78 | Temp 97.8°F | Ht 76.0 in | Wt 276.0 lb

## 2021-06-18 DIAGNOSIS — R0602 Shortness of breath: Secondary | ICD-10-CM

## 2021-06-18 DIAGNOSIS — M4802 Spinal stenosis, cervical region: Secondary | ICD-10-CM | POA: Diagnosis not present

## 2021-06-18 DIAGNOSIS — G4733 Obstructive sleep apnea (adult) (pediatric): Secondary | ICD-10-CM | POA: Diagnosis not present

## 2021-06-18 DIAGNOSIS — J986 Disorders of diaphragm: Secondary | ICD-10-CM | POA: Diagnosis not present

## 2021-06-18 NOTE — Patient Instructions (Signed)
Continue staying active.  We will see him in follow-up in 3 months time we will schedule a sniff test for evaluation of your right diaphragm prior to your follow-up visit.   Call sooner should you have any new issues with increasing shortness of breath, cough etc.

## 2021-06-18 NOTE — Progress Notes (Signed)
Subjective:    Patient ID: Eric Faulkner, male    DOB: 1980/08/11, 41 y.o.   MRN: [id number] Patient Care Team: Excell Seltzer, MD as PCP - General [facility])  Chief Complaint  Patient presents with   Follow-up    CXR today--no current sx.    HPI The patient is a 41 year old former smoker (10 PY) who presents for follow-up of dyspnea worse on supine position.  He was last seen on 13 April 2021 he was referred to neurosurgery at that time.  Patient currently would like to try therapies without C-spine surgery if he can avoid it.  Believe that neurosurgery has requested additional EMGs.  Patient also had a sleep study which showed only mild sleep apnea he would like to avoid using CPAP and is instead working on discontinuing all alcohol intake and weight loss.  The patient continues to have shortness of breath mostly when he lays flat or bends over however, he notes that this is markedly improved from his initial visit here.  He can now sleep in a fully supine position without increasing shortness of breath.  He does not notice the dyspnea standing up or sitting upright. He has had no fevers, chills or sweats.  No other symptomatology.  No weight loss or anorexia.  No cough or sputum production.  He requested another chest x-ray which was performed today as he felt better and was hoping his diaphragm was moving again.  Review of the film taken today shows no change.    Patient does not endorse any other new symptomatology.  He has been doing well, he has been curtailing his alcohol intake.  Engaging in weight loss.   DATA 12/05/2020 chest x-ray: Significant elevation of the right hemidiaphragm 12/13/2020 sniff test: Normal left diaphragmatic motion, decreased overall right diaphragmatic motion compared to left but not complete paralysis 12/28/2020 CT coronary: Normal coronaries with calcium score of 0. 02/02/2021 PFTs: Moderate restriction, no obstruction, no bronchodilator response.   There is significant decrease in slow vital capacity when compared to sitting versus supine positions.  This is consistent with diaphragmatic dysfunction.  Diffusion capacity normal when corrected for Kco 02/09/2021 CT chest abdomen and pelvis: Elevation of the right hemidiaphragm, normal left hemidiaphragm.   Atelectasis of the right lower lobe associated with elevation of the right hemidiaphragm.  No focal lesions noted, no explanation for diaphragm elevation.  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  Moderate to severe bilateral C4 neural foraminal stenosis greater on the left.  Up to moderate foraminal stenosis at the left C6 and bilateral C7 nerve levels. 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 05/29/2021 sleep study: Mild sleep apnea AHI of 5.2, did not qualify for split night study, did have frequent PVCs initially which resolved with doors the study.  Oxygen saturations never below 86%. 06/18/2021 chest x-ray: No significant change from previous showing elevation of the right hemidiaphragm.  Review of Systems A 10 point review of systems was performed and it is as noted above otherwise negative.  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   Hypotension 07/14/2020   Sebaceous cyst of axilla 11/12/2019   Gastroesophageal reflux disease without esophagitis 11/12/2019    Bipolar affective disorder (HCC) 08/06/2018   Seasonal allergies 06/30/2014   Social History   Tobacco Use   Smoking status: Former    Packs/day: 0.50    Years: 20.00    Pack years: 10.00    Types: Cigars, Cigarettes    Quit date: 02/29/2020    Years since quitting: 1.3   Smokeless tobacco: Never  Substance Use Topics   Alcohol use: Yes    Comment: Was drinking a pint of liquor every other day for the past 8 months but as of Saturday, 12/17/20 is going to stop drinking so much.   No Known Allergies  Current Meds  Medication Sig   lithium carbonate (LITHOBID) 300 MG CR tablet Take 900 mg by mouth at bedtime.   QUEtiapine (SEROQUEL) 50 MG tablet Take 50 mg by mouth at bedtime.   Immunization History  Administered Date(s) Administered   PFIZER(Purple Top)SARS-COV-2 Vaccination 10/22/2019, 11/01/2019, 01/26/2020   Tdap 07/11/2015       Objective:   Physical Exam BP 120/82 (BP Location: Left Arm, Cuff Size: Large)   Pulse 78   Temp 97.8 F (36.6 C) (Temporal)   Ht 6\' 4"  (1.93 m)   Wt 276 lb (125.2 kg)   SpO2 97%   BMI 33.60 kg/m  GENERAL: Well-nourished, well-developed gentleman, no acute distress.  Fully ambulatory.  No conversational dyspnea, articulate. HEAD: Normocephalic, atraumatic.  EYES: Pupils equal, round, reactive to light.  No scleral icterus.  MOUTH: Nose/mouth/throat not examined due to masking requirements for COVID 19. NECK: Supple. No thyromegaly. Trachea midline. No JVD.  No adenopathy. PULMONARY: Good air entry bilaterally.  Diminished breath sounds on the right base.  CARDIOVASCULAR: S1 and S2. Regular rate and rhythm.  No rubs, murmurs or gallops heard. ABDOMEN: Benign. MUSCULOSKELETAL: No joint deformity, no clubbing, no edema.  NEUROLOGIC: No focal deficit, no gait disturbance, speech is fluent. SKIN: Intact,warm,dry. PSYCH: Mood and behavior normal.    Assessment & Plan:     ICD-10-CM   1. Elevated hemidiaphragm  J98.6 DG Sniff Test    Chest x-ray unchanged Follow-up 3 months Sniff test prior to follow-up    2. SOB (shortness of breath)  R06.02    Improving day today Notes increased stamina    3. Foraminal stenosis of cervical region  M48.02    Following with neurosurgery Pursuing conservative management at present    4. OSA (obstructive sleep apnea) - mild  G47.33    AHI 5.2, O2 sats 86% nadir Patient declines CPAP Engaging in weight loss and discontinuing alcohol     Orders Placed This Encounter  Procedures   DG Sniff Test    16mo    Standing Status:   Future    Standing Expiration Date:   06/19/2022    Order Specific Question:   Reason for Exam (SYMPTOM  OR DIAGNOSIS REQUIRED)    Answer:   elevated hemidiaphrahm    Order Specific Question:   Preferred imaging location?    Answer:   [facility]   We will see the patient in follow-up in 3 months time he is to contact 08/19/2022 prior to that time should any new difficulties arise.   [place], MD Advanced Bronchoscopy PCCM [place] [facility]    *  This note was dictated using voice recognition software/Dragon.  Despite best efforts to proofread, errors can occur which can change the meaning. Any transcriptional errors that result from this process are unintentional and may not be fully corrected at the time of dictation.

## 2021-07-22 ENCOUNTER — Other Ambulatory Visit: Payer: Self-pay | Admitting: Family Medicine

## 2021-07-22 NOTE — Telephone Encounter (Signed)
Last office visit  12/15/20 for Discuss sniff test and palpitations. Doxycycline not on current medication list. No future appointments with PCP.

## 2021-08-27 DIAGNOSIS — F3189 Other bipolar disorder: Secondary | ICD-10-CM | POA: Diagnosis not present

## 2021-09-18 ENCOUNTER — Inpatient Hospital Stay: Admission: RE | Admit: 2021-09-18 | Payer: Federal, State, Local not specified - PPO | Source: Ambulatory Visit

## 2021-09-20 ENCOUNTER — Encounter: Payer: Self-pay | Admitting: Pulmonary Disease

## 2021-09-20 ENCOUNTER — Ambulatory Visit: Payer: Federal, State, Local not specified - PPO | Admitting: Pulmonary Disease

## 2021-09-20 VITALS — BP 138/76 | HR 81 | Temp 97.8°F | Ht 76.0 in | Wt 278.6 lb

## 2021-09-20 DIAGNOSIS — G4733 Obstructive sleep apnea (adult) (pediatric): Secondary | ICD-10-CM | POA: Diagnosis not present

## 2021-09-20 DIAGNOSIS — J986 Disorders of diaphragm: Secondary | ICD-10-CM

## 2021-09-20 DIAGNOSIS — R0602 Shortness of breath: Secondary | ICD-10-CM

## 2021-09-20 DIAGNOSIS — M4802 Spinal stenosis, cervical region: Secondary | ICD-10-CM

## 2021-09-20 NOTE — Progress Notes (Signed)
Subjective:    Patient ID: Eric Faulkner, male    DOB: 07-31-1980, 41 y.o.   MRN: [id number] Patient Care Team: Excell Seltzer, MD as PCP - General [facility])  Chief Complaint  Patient presents with   Follow-up    No current sx.    HPI The patient is a 41 year old former smoker (10 PY) who presents for follow-up of dyspnea worse on supine position.  He was last seen on 18 June 2021.  At that time he was declining neurosurgery for his C-spine stenosis after evaluation by neurosurgery and also was declining CPAP for mild sleep apnea.  The patient states that the shortness of breath he was having mostly when he lays flat or bends over continues to improve and he has been feeling better.  He can now sleep in a fully supine position without increasing shortness of breath.  He does not notice the dyspnea standing up or sitting upright. He has had no fevers, chills or sweats.  No other symptomatology. No weight loss or anorexia.  No cough or sputum production.  He has engaged in an exercise program.  Overall feels that he is improving.Patient does not endorse any other new symptomatology.  He has been doing well, he has been curtailing his alcohol intake.    DATA 12/05/2020 chest x-ray: Significant elevation of the right hemidiaphragm 12/13/2020 sniff test: Normal left diaphragmatic motion, decreased overall right diaphragmatic motion compared to left but not complete paralysis 12/28/2020 CT coronary: Normal coronaries with calcium score of 0. 02/02/2021 PFTs: Moderate restriction, no obstruction, no bronchodilator response.  There is significant decrease in slow vital capacity when compared to sitting versus supine positions.  This is consistent with diaphragmatic dysfunction.  Diffusion capacity normal when corrected for Kco 02/09/2021 CT chest abdomen and pelvis: Elevation of the right hemidiaphragm, normal left hemidiaphragm.   Atelectasis of the right lower lobe associated with  elevation of the right hemidiaphragm.  No focal lesions noted, no explanation for diaphragm elevation.  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  Moderate to severe bilateral C4 neural foraminal stenosis greater on the left.  Up to moderate foraminal stenosis at the left C6 and bilateral C7 nerve levels. 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 05/29/2021 sleep study: Mild sleep apnea AHI of 5.2, did not qualify for split night study, did have frequent PVCs initially which resolved with doors the study.  Oxygen saturations never below 86%. 06/18/2021 chest x-ray: No significant change from previous showing elevation of the right hemidiaphragm.  Review of Systems A 10 point review of systems was performed and it is as noted above otherwise negative.  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   No Known Allergies  Current Meds  Medication Sig   lithium carbonate (LITHOBID) 300 MG CR tablet Take 900 mg by mouth at bedtime.   QUEtiapine (SEROQUEL) 50 MG tablet Take 50 mg by mouth at bedtime.   Immunization History  Administered Date(s) Administered   PFIZER(Purple Top)SARS-COV-2 Vaccination 10/22/2019, 11/01/2019, 01/26/2020   Tdap 07/11/2015       Objective:   Physical Exam BP 138/76 (BP Location: Left Arm, Cuff Size: Normal)   Pulse 81   Temp 97.8 F (36.6 C) (Temporal)   Ht 6\' 4"  (1.93 m)   Wt 278 lb 9.6 oz (126.4 kg)   SpO2 97%   BMI 33.91 kg/m  GENERAL: Well-nourished, well-developed gentleman, no acute distress.  Fully ambulatory.  No conversational dyspnea, very articulate. HEAD: Normocephalic, atraumatic.  EYES: Pupils equal, round, reactive to light.  No scleral icterus.  MOUTH: Nose/mouth/throat not examined due to masking requirements for COVID 19. NECK: Supple. No thyromegaly. Trachea midline. No JVD.  No adenopathy. PULMONARY: Good air entry bilaterally.  Diminished breath sounds on the right base.  CARDIOVASCULAR: S1 and S2. Regular rate and rhythm.  No rubs, murmurs or gallops heard. ABDOMEN: Benign. MUSCULOSKELETAL: No joint deformity, no clubbing, no edema.  NEUROLOGIC: No focal deficit, no gait disturbance, speech is fluent. SKIN: Intact,warm,dry. PSYCH: Mood and behavior normal.      Assessment & Plan:     ICD-10-CM   1. Elevated hemidiaphragm  J98.6 DG Chest 2 View   Symptomatically improved We will follow-up in 6 months with chest x-ray at that time    2. SOB (shortness of breath)  R06.02    Continues to improve Commend continued engagement in exercise    3. Foraminal stenosis of cervical region  M48.02    Declines neurosurgery Pursuing conservative management    4. OSA (obstructive sleep apnea) - mild  G47.33    Declines CPAP Engaging in weight loss      Orders Placed This Encounter  Procedures   DG Chest 2 View    Standing Status:   Future    Standing Expiration Date:   03/21/2022    Order Specific Question:   Reason for Exam (SYMPTOM  OR DIAGNOSIS REQUIRED)    Answer:   hemidiaphragm    Order Specific Question:   Preferred imaging location?    Answer:   [facility]   We will see the patient in follow-up in 6 months time he is to contact 05/21/2022 prior to that time should any difficulties arise.  [place], MD Advanced Bronchoscopy PCCM  [facility]    *This note was dictated using voice recognition software/Dragon.  Despite best efforts to proofread, errors can occur which can change the meaning. Any transcriptional errors that result from this process are unintentional and may not be fully corrected at the time of dictation.

## 2021-09-20 NOTE — Patient Instructions (Signed)
We will see him in follow-up in 6 months time we will get a chest x-ray at that time.

## 2021-10-11 DIAGNOSIS — L300 Nummular dermatitis: Secondary | ICD-10-CM | POA: Diagnosis not present

## 2021-10-11 DIAGNOSIS — L700 Acne vulgaris: Secondary | ICD-10-CM | POA: Diagnosis not present

## 2021-10-31 DIAGNOSIS — L732 Hidradenitis suppurativa: Secondary | ICD-10-CM | POA: Diagnosis not present

## 2021-10-31 DIAGNOSIS — L910 Hypertrophic scar: Secondary | ICD-10-CM | POA: Diagnosis not present

## 2021-11-19 DIAGNOSIS — Z79899 Other long term (current) drug therapy: Secondary | ICD-10-CM | POA: Diagnosis not present

## 2021-11-19 DIAGNOSIS — F3189 Other bipolar disorder: Secondary | ICD-10-CM | POA: Diagnosis not present

## 2021-11-19 DIAGNOSIS — N289 Disorder of kidney and ureter, unspecified: Secondary | ICD-10-CM | POA: Diagnosis not present

## 2021-11-26 DIAGNOSIS — Z79899 Other long term (current) drug therapy: Secondary | ICD-10-CM | POA: Diagnosis not present

## 2021-11-26 DIAGNOSIS — N289 Disorder of kidney and ureter, unspecified: Secondary | ICD-10-CM | POA: Diagnosis not present

## 2021-12-14 DIAGNOSIS — L732 Hidradenitis suppurativa: Secondary | ICD-10-CM | POA: Diagnosis not present

## 2022-01-27 ENCOUNTER — Emergency Department
Admission: EM | Admit: 2022-01-27 | Discharge: 2022-01-27 | Disposition: A | Discharge status: Home / Self Care | Payer: Federal, State, Local not specified - PPO | Attending: Emergency Medicine | Admitting: Emergency Medicine

## 2022-01-27 ENCOUNTER — Emergency Department: Payer: Federal, State, Local not specified - PPO

## 2022-01-27 ENCOUNTER — Other Ambulatory Visit: Payer: Self-pay

## 2022-01-27 DIAGNOSIS — R079 Chest pain, unspecified: Secondary | ICD-10-CM | POA: Diagnosis not present

## 2022-01-27 DIAGNOSIS — R0789 Other chest pain: Secondary | ICD-10-CM | POA: Insufficient documentation

## 2022-01-27 DIAGNOSIS — M25512 Pain in left shoulder: Secondary | ICD-10-CM | POA: Diagnosis not present

## 2022-01-27 LAB — BASIC METABOLIC PANEL
Anion gap: 6 (ref 5–15)
BUN: 22 mg/dL — ABNORMAL HIGH (ref 6–20)
CO2: 24 mmol/L (ref 22–32)
Calcium: 9 mg/dL (ref 8.9–10.3)
Chloride: 112 mmol/L — ABNORMAL HIGH (ref 98–111)
Creatinine, Ser: 1.38 mg/dL — ABNORMAL HIGH (ref 0.61–1.24)
GFR, Estimated: >60 mL/min (ref >60)
Glucose, Bld: 90 mg/dL (ref 70–99)
Potassium: 3.5 mmol/L (ref 3.5–5.1)
Sodium: 142 mmol/L (ref 135–145)

## 2022-01-27 LAB — CBC
HCT: 41.7 % (ref 39.0–52.0)
Hemoglobin: 14.2 g/dL (ref 13.0–17.0)
MCH: 27.4 pg (ref 26.0–34.0)
MCHC: 34.1 g/dL (ref 30.0–36.0)
MCV: 80.3 fL (ref 80.0–100.0)
Platelets: 256 10*3/uL (ref 150–400)
RBC: 5.19 MIL/uL (ref 4.22–5.81)
RDW: 14.3 % (ref 11.5–15.5)
WBC: 8.3 10*3/uL (ref 4.0–10.5)
nRBC: 0 % (ref 0.0–0.2)

## 2022-01-27 LAB — TROPONIN I (HIGH SENSITIVITY): Troponin I (High Sensitivity): 5 ng/L (ref <18)

## 2022-01-27 LAB — D-DIMER, QUANTITATIVE: D-Dimer, Quant: <0.27 ug/mL-FEU (ref 0.00–0.50)

## 2022-01-27 NOTE — ED Notes (Signed)
Blue top and red top tubes sent to lab.

## 2022-01-27 NOTE — ED Provider Notes (Signed)
[facility] Provider Note    Event Date/Time   First MD Initiated Contact with Patient 01/27/22 1328     (approximate)   History   Chief Complaint Chest Pain   HPI  Eric Faulkner is a 42 y.o. male with past medical history of bipolar disorder, alcohol abuse, and hidradenitis who presents to the ED complaining of chest pain.  Patient reports that he has been dealing with constant pain in the left side of his chest radiating towards his left upper back for the past 2 days.  He states the pain is sharp and worse when he takes a deep breath.  He denies any associated fevers, cough, or difficulty breathing.  He has not noticed any pain or swelling in his legs.  He denies any history of similar symptoms, denies cardiac history.     Physical Exam   Triage Vital Signs: ED Triage Vitals  Enc Vitals Group     BP 01/27/22 1220 129/86     Pulse Rate 01/27/22 1220 92     Resp 01/27/22 1220 16     Temp 01/27/22 1220 98.4 F (36.9 C)     Temp Source 01/27/22 1220 Oral     SpO2 01/27/22 1220 95 %     Weight 01/27/22 1214 285 lb (129.3 kg)     Height 01/27/22 1214 6\' 4"  (1.93 m)     Head Circumference --      Peak Flow --      Pain Score 01/27/22 1213 2     Pain Loc --      Pain Edu? --      Excl. in [place]? --     Most recent vital signs: Vitals:   01/27/22 1220  BP: 129/86  Pulse: 92  Resp: 16  Temp: 98.4 F (36.9 C)  SpO2: 95%    Constitutional: Alert and oriented. Eyes: Conjunctivae are normal. Head: Atraumatic. Nose: No congestion/rhinnorhea. Mouth/Throat: Mucous membranes are moist.  Cardiovascular: Normal rate, regular rhythm. Grossly normal heart sounds.  2+ radial pulses bilaterally. Respiratory: Normal respiratory effort.  No retractions. Lungs CTAB.  No chest wall tenderness to palpation. Gastrointestinal: Soft and nontender. No distention. Musculoskeletal: No lower extremity tenderness nor edema.  Neurologic:  Normal speech and  language. No gross focal neurologic deficits are appreciated.    ED Results / Procedures / Treatments   Labs (all labs ordered are listed, but only abnormal results are displayed) Labs Reviewed  BASIC METABOLIC PANEL - Abnormal; Notable for the following components:      Result Value   Chloride 112 (*)    BUN 22 (*)    Creatinine, Ser 1.38 (*)    All other components within normal limits  CBC  D-DIMER, QUANTITATIVE  TROPONIN I (HIGH SENSITIVITY)     EKG  ED ECG REPORT I, Blake Divine, the attending physician, personally viewed and interpreted this ECG.   Date: 01/27/2022  EKG Time: 12:19  Rate: 93  Rhythm: normal sinus rhythm  Axis: Normal  Intervals:none  ST&T Change: None  RADIOLOGY Chest x-ray reviewed and interpreted by me with no infiltrate, edema, or effusion.  PROCEDURES:  Critical Care performed: No  Procedures   MEDICATIONS ORDERED IN ED: Medications - No data to display   IMPRESSION / MDM / [place] / ED COURSE  I reviewed the triage vital signs and the nursing notes.  42 y.o. male with past medical history of bipolar disorder, alcohol abuse, and hidradenitis who presents to the ED with 2 days of constant pain in his left chest radiating to his left upper back that is worse with a deep breath.  Patient's presentation is most consistent with acute presentation with potential threat to life or bodily function.  Differential diagnosis includes, but is not limited to, ACS, PE, pneumonia, pneumothorax, dissection, musculoskeletal pain, GERD, and anxiety.  Patient well-appearing and in no acute distress, vital signs are unremarkable.  EKG shows no evidence of arrhythmia or ischemia and symptoms are atypical for ACS.  Troponin within normal limits and I doubt ACS given constant pain for the past 2 days along with atypical symptoms.  Labs are reassuring with no significant anemia, leukocytosis, electrolyte  abnormality, or AKI.  Chest x-ray is also unremarkable.  D-dimer within normal limits and I doubt PE given patient is low risk by Wells criteria.  Suspect musculoskeletal etiology or pleurisy and patient is appropriate for discharge home with PCP follow-up.  He was counseled to return to the ED for new or worsening symptoms, patient agrees with plan.      FINAL CLINICAL IMPRESSION(S) / ED DIAGNOSES   Final diagnoses:  Atypical chest pain     Rx / DC Orders   ED Discharge Orders     None        Note:  This document was prepared using Dragon voice recognition software and may include unintentional dictation errors.   Blake Divine, MD 01/27/22 [phone]

## 2022-01-27 NOTE — ED Triage Notes (Signed)
Pt to ED for pain in chest when breathes deeply especially to L shoulder blade area. Thinks may have started yesterday or at least worsened yesterday. Denies known cardiac, pulmonary disease and denies blood thinner use.  Respirations are unlabored.   Has been drinking about 1 pint vodka every other day. Last drink was last night, about 5 drinks of hard liquor. Does not appear intoxicated.  Former cigar smoker, has been smoking a little this past month.  Red top and blue top sent in addition to basic labs (not ordered at this time).

## 2022-01-29 ENCOUNTER — Ambulatory Visit: Payer: Federal, State, Local not specified - PPO | Admitting: Family Medicine

## 2022-01-29 ENCOUNTER — Encounter: Payer: Self-pay | Admitting: Family Medicine

## 2022-01-29 VITALS — BP 140/90 | HR 80 | Temp 98.4°F | Ht 75.5 in | Wt 288.1 lb

## 2022-01-29 DIAGNOSIS — R0789 Other chest pain: Secondary | ICD-10-CM | POA: Diagnosis not present

## 2022-01-29 NOTE — Patient Instructions (Signed)
Ibuprofen (advil or motrin) 600-800 mg every 8 hours.  Take it with food. Then decrease to as needed.

## 2022-01-29 NOTE — Progress Notes (Signed)
Patient ID: Eric Faulkner, male    DOB: July 03, 1980, 42 y.o.   MRN: [id number]  This visit was conducted in person.  BP (!) 140/90   Pulse 80   Temp 98.4 F (36.9 C) (Oral)   Ht 6' 3.5" (1.918 m)   Wt 288 lb 2 oz (130.7 kg)   SpO2 96%   BMI 35.54 kg/m    CC:  Chief Complaint  Patient presents with   Hospitalization Follow-up    ER follow up for Atypical Chest Pain    Subjective:   HPI: Eric Faulkner is a 42 y.o. male presenting on 01/29/2022 for Hospitalization Follow-up (ER follow up for Atypical Chest Pain)  Seen in ED on January 27, 2022 for atypical chest pain. Reported constant pain on left side of chest radiating towards his left upper back, worse with deep breaths.  Reviewed EKG, chest x-ray, labs including CBC, c-Met, D-dimer and troponin  Unremarkable except for slightly worsened renal function. Felt likely musculoskeletal etiology versus pleurisy    Occ sharp cramp in heart.  Better with arm above head and lying on that side.  Occ heart flutter, no nausea  No SOB.  No cough, no fever.  No rash  No heartburn. . Cardiac CTA No significant coronary blockages or calcium   Had similar issue in past.. 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    Relevant past medical, surgical, family and social history reviewed and updated as indicated. Interim medical history since our last visit reviewed. Allergies and medications reviewed and updated. Outpatient Medications Prior to Visit  Medication Sig Dispense Refill   lithium carbonate (LITHOBID) 300 MG CR tablet Take 900 mg by mouth at bedtime.     QUEtiapine (SEROQUEL) 50 MG tablet Take 50 mg by mouth at bedtime.     No facility-administered medications prior to visit.     Per HPI unless specifically indicated in ROS section below Review of Systems  Constitutional:  Negative for fatigue and fever.  HENT:  Negative for ear pain.   Eyes:   Negative for pain.  Respiratory:  Negative for cough and shortness of breath.   Cardiovascular:  Negative for chest pain, palpitations and leg swelling.  Gastrointestinal:  Negative for abdominal pain.  Genitourinary:  Negative for dysuria.  Musculoskeletal:  Negative for arthralgias.  Neurological:  Negative for syncope, light-headedness and headaches.  Psychiatric/Behavioral:  Negative for dysphoric mood.    Objective:  BP (!) 140/90   Pulse 80   Temp 98.4 F (36.9 C) (Oral)   Ht 6' 3.5" (1.918 m)   Wt 288 lb 2 oz (130.7 kg)   SpO2 96%   BMI 35.54 kg/m   Wt Readings from Last 3 Encounters:  01/29/22 288 lb 2 oz (130.7 kg)  01/27/22 285 lb (129.3 kg)  09/20/21 278 lb 9.6 oz (126.4 kg)      Physical Exam Constitutional:      Appearance: He is well-developed.  HENT:     Head: Normocephalic.     Right Ear: Hearing normal.     Left Ear: Hearing normal.     Nose: Nose normal.  Neck:     Thyroid: No thyroid mass or thyromegaly.     Vascular: No carotid bruit.     Trachea: Trachea normal.  Cardiovascular:     Rate and Rhythm: Normal rate and regular rhythm.     Pulses: Normal pulses.     Heart sounds:  Heart sounds not distant. No murmur heard.    No friction rub. No gallop.     Comments: No peripheral edema Pulmonary:     Effort: Pulmonary effort is normal. No respiratory distress.     Breath sounds: Normal breath sounds.  Skin:    General: Skin is warm and dry.     Findings: No rash.  Psychiatric:        Speech: Speech normal.        Behavior: Behavior normal.        Thought Content: Thought content normal.       Results for orders placed or performed during the hospital encounter of 59/16/38  Basic metabolic panel  Result Value Ref Range   Sodium 142 135 - 145 mmol/L   Potassium 3.5 3.5 - 5.1 mmol/L   Chloride 112 (H) 98 - 111 mmol/L   CO2 24 22 - 32 mmol/L   Glucose, Bld 90 70 - 99 mg/dL   BUN 22 (H) 6 - 20 mg/dL   Creatinine, Ser 1.38 (H) 0.61 - 1.24  mg/dL   Calcium 9.0 8.9 - 10.3 mg/dL   GFR, Estimated >60 >60 mL/min   Anion gap 6 5 - 15  CBC  Result Value Ref Range   WBC 8.3 4.0 - 10.5 K/uL   RBC 5.19 4.22 - 5.81 MIL/uL   Hemoglobin 14.2 13.0 - 17.0 g/dL   HCT 41.7 39.0 - 52.0 %   MCV 80.3 80.0 - 100.0 fL   MCH 27.4 26.0 - 34.0 pg   MCHC 34.1 30.0 - 36.0 g/dL   RDW 14.3 11.5 - 15.5 %   Platelets 256 150 - 400 K/uL   nRBC 0.0 0.0 - 0.2 %  D-dimer, quantitative  Result Value Ref Range   D-Dimer, Quant <0.27 0.00 - 0.50 ug/mL-FEU  Troponin I (High Sensitivity)  Result Value Ref Range   Troponin I (High Sensitivity) 5 <18 ng/L    Assessment and Plan  Atypical chest pain Assessment & Plan: Chronic intermittent. Negative cardiopulmonary workup in ER including EKG, chest x-ray, labs including CBC, c-Met, D-dimer and troponin.  Symptoms seem most consistent with musculoskeletal strain versus pleurisy. Less likely secondary to history of elevated hemidiaphragm.   Ibuprofen (advil or motrin) 600-800 mg every 8 hours.  Take it with food. Then decrease to as needed.      Return if symptoms worsen or fail to improve.   Eliezer Lofts, MD

## 2022-02-05 ENCOUNTER — Encounter: Payer: Self-pay | Admitting: Family Medicine

## 2022-02-05 DIAGNOSIS — R0789 Other chest pain: Secondary | ICD-10-CM | POA: Insufficient documentation

## 2022-02-05 DIAGNOSIS — R0600 Dyspnea, unspecified: Secondary | ICD-10-CM

## 2022-02-05 NOTE — Assessment & Plan Note (Addendum)
Chronic intermittent. Negative cardiopulmonary workup in ER including EKG, chest x-ray, labs including CBC, c-Met, D-dimer and troponin.  Symptoms seem most consistent with musculoskeletal strain versus pleurisy. Less likely secondary to history of elevated hemidiaphragm.   Ibuprofen (advil or motrin) 600-800 mg every 8 hours.  Take it with food. Then decrease to as needed.

## 2022-02-12 DIAGNOSIS — F3189 Other bipolar disorder: Secondary | ICD-10-CM | POA: Diagnosis not present

## 2022-02-14 DIAGNOSIS — Z713 Dietary counseling and surveillance: Secondary | ICD-10-CM | POA: Diagnosis not present

## 2022-03-01 DIAGNOSIS — L910 Hypertrophic scar: Secondary | ICD-10-CM | POA: Diagnosis not present

## 2022-03-01 DIAGNOSIS — L732 Hidradenitis suppurativa: Secondary | ICD-10-CM | POA: Diagnosis not present

## 2022-03-12 DIAGNOSIS — Z713 Dietary counseling and surveillance: Secondary | ICD-10-CM | POA: Diagnosis not present

## 2022-03-29 DIAGNOSIS — Z79899 Other long term (current) drug therapy: Secondary | ICD-10-CM | POA: Diagnosis not present

## 2022-04-03 ENCOUNTER — Encounter: Payer: Self-pay | Admitting: Family Medicine

## 2022-04-09 ENCOUNTER — Encounter: Payer: Self-pay | Admitting: Family Medicine

## 2022-04-09 ENCOUNTER — Ambulatory Visit: Payer: Federal, State, Local not specified - PPO | Admitting: Family Medicine

## 2022-04-09 VITALS — BP 130/84 | HR 75 | Temp 97.7°F | Ht 75.5 in | Wt 282.5 lb

## 2022-04-09 DIAGNOSIS — Z13220 Encounter for screening for lipoid disorders: Secondary | ICD-10-CM

## 2022-04-09 DIAGNOSIS — Z0000 Encounter for general adult medical examination without abnormal findings: Secondary | ICD-10-CM | POA: Diagnosis not present

## 2022-04-09 DIAGNOSIS — Z113 Encounter for screening for infections with a predominantly sexual mode of transmission: Secondary | ICD-10-CM

## 2022-04-09 DIAGNOSIS — N289 Disorder of kidney and ureter, unspecified: Secondary | ICD-10-CM | POA: Insufficient documentation

## 2022-04-09 DIAGNOSIS — Z79899 Other long term (current) drug therapy: Secondary | ICD-10-CM | POA: Diagnosis not present

## 2022-04-09 DIAGNOSIS — E559 Vitamin D deficiency, unspecified: Secondary | ICD-10-CM | POA: Diagnosis not present

## 2022-04-09 DIAGNOSIS — R809 Proteinuria, unspecified: Secondary | ICD-10-CM

## 2022-04-09 DIAGNOSIS — Z131 Encounter for screening for diabetes mellitus: Secondary | ICD-10-CM | POA: Diagnosis not present

## 2022-04-09 NOTE — Progress Notes (Signed)
Patient ID: Eric Faulkner, male    DOB: 06-17-1980, 42 y.o.   MRN: [id number]  This visit was conducted in person.  BP 130/84   Pulse 75   Temp 97.7 F (36.5 C) (Temporal)   Ht 6' 3.5" (1.918 m)   Wt 282 lb 8 oz (128.1 kg)   SpO2 98%   BMI 34.84 kg/m    CC:  Chief Complaint  Patient presents with   Lab Results    Discuss lab results concerns    Subjective:   HPI: Eric Faulkner is a 42 y.o. male presenting on 04/09/2022 for Lab Results (Discuss lab results concerns) AND REQUESTING CPX.  The patient presents for  complete physical and review of chronic health problems. He/She also has the following acute concerns today:  Recent labs in January showed slight decrease in kidney function remaining in normal range. Creatinine 1.38, BUN 22 and GFR >60   He is on lithium.. nml level 03/29/2022  Creatinine 1.43.   Gets 64 ounces a day.. no D/V.  BP Readings from Last 3 Encounters:  04/09/22 130/84  01/29/22 (!) 140/90  01/27/22 126/86    He was drinking more alcohol  in January... since stopping ETOH 70 days ago.. less chest pain. .    Relevant past medical, surgical, family and social history reviewed and updated as indicated. Interim medical history since our last visit reviewed. Allergies and medications reviewed and updated. Outpatient Medications Prior to Visit  Medication Sig Dispense Refill   lithium carbonate (LITHOBID) 300 MG CR tablet Take 900 mg by mouth at bedtime.     QUEtiapine (SEROQUEL) 50 MG tablet Take 50 mg by mouth at bedtime.     No facility-administered medications prior to visit.     Per HPI unless specifically indicated in ROS section below Review of Systems  Constitutional:  Negative for fatigue and fever.  HENT:  Negative for ear pain.   Eyes:  Negative for pain.  Respiratory:  Negative for cough and shortness of breath.   Cardiovascular:  Negative for chest pain, palpitations and leg swelling.  Gastrointestinal:  Negative for  abdominal pain.  Genitourinary:  Negative for dysuria.  Musculoskeletal:  Negative for arthralgias.  Neurological:  Negative for syncope, light-headedness and headaches.  Psychiatric/Behavioral:  Negative for dysphoric mood.        He denies mania but his circadian rhythms been off lately sleeping more during the day awake at night.  Trying to get back to regular activity during the day.   Objective:  BP 130/84   Pulse 75   Temp 97.7 F (36.5 C) (Temporal)   Ht 6' 3.5" (1.918 m)   Wt 282 lb 8 oz (128.1 kg)   SpO2 98%   BMI 34.84 kg/m   Wt Readings from Last 3 Encounters:  04/09/22 282 lb 8 oz (128.1 kg)  01/29/22 288 lb 2 oz (130.7 kg)  01/27/22 285 lb (129.3 kg)      Physical Exam Constitutional:      General: He is not in acute distress.    Appearance: Normal appearance. He is well-developed. He is not ill-appearing or toxic-appearing.  HENT:     Head: Normocephalic and atraumatic.     Right Ear: Hearing, tympanic membrane, ear canal and external ear normal.     Left Ear: Hearing, tympanic membrane, ear canal and external ear normal.     Nose: Nose normal.     Mouth/Throat:     Pharynx: Uvula midline.  Eyes:     General: Lids are normal. Lids are everted, no foreign bodies appreciated.     Conjunctiva/sclera: Conjunctivae normal.     Pupils: Pupils are equal, round, and reactive to light.  Neck:     Thyroid: No thyroid mass or thyromegaly.     Vascular: No carotid bruit.     Trachea: Trachea and phonation normal.  Cardiovascular:     Rate and Rhythm: Normal rate and regular rhythm.     Pulses: Normal pulses.     Heart sounds: S1 normal and S2 normal. No murmur heard.    No gallop.  Pulmonary:     Breath sounds: Normal breath sounds. No wheezing, rhonchi or rales.  Abdominal:     General: Bowel sounds are normal.     Palpations: Abdomen is soft.     Tenderness: There is no abdominal tenderness. There is no guarding or rebound.     Hernia: No hernia is present.   Musculoskeletal:     Cervical back: Normal range of motion and neck supple.  Lymphadenopathy:     Cervical: No cervical adenopathy.  Skin:    General: Skin is warm and dry.     Findings: No rash.  Neurological:     Mental Status: He is alert.     Cranial Nerves: No cranial nerve deficit.     Sensory: No sensory deficit.     Gait: Gait normal.     Deep Tendon Reflexes: Reflexes are normal and symmetric.  Psychiatric:        Speech: Speech normal.        Behavior: Behavior normal.        Judgment: Judgment normal.       Results for orders placed or performed during the hospital encounter of 0000000  Basic metabolic panel  Result Value Ref Range   Sodium 142 135 - 145 mmol/L   Potassium 3.5 3.5 - 5.1 mmol/L   Chloride 112 (H) 98 - 111 mmol/L   CO2 24 22 - 32 mmol/L   Glucose, Bld 90 70 - 99 mg/dL   BUN 22 (H) 6 - 20 mg/dL   Creatinine, Ser 1.38 (H) 0.61 - 1.24 mg/dL   Calcium 9.0 8.9 - 10.3 mg/dL   GFR, Estimated >60 >60 mL/min   Anion gap 6 5 - 15  CBC  Result Value Ref Range   WBC 8.3 4.0 - 10.5 K/uL   RBC 5.19 4.22 - 5.81 MIL/uL   Hemoglobin 14.2 13.0 - 17.0 g/dL   HCT 41.7 39.0 - 52.0 %   MCV 80.3 80.0 - 100.0 fL   MCH 27.4 26.0 - 34.0 pg   MCHC 34.1 30.0 - 36.0 g/dL   RDW 14.3 11.5 - 15.5 %   Platelets 256 150 - 400 K/uL   nRBC 0.0 0.0 - 0.2 %  D-dimer, quantitative  Result Value Ref Range   D-Dimer, Quant <0.27 0.00 - 0.50 ug/mL-FEU  Troponin I (High Sensitivity)  Result Value Ref Range   Troponin I (High Sensitivity) 5 <18 ng/L    Assessment and Plan The patient's preventative maintenance and recommended screening tests for an annual wellness exam were reviewed in full today. Brought up to date unless services declined.  Counselled on the importance of diet, exercise, and its role in overall health and mortality. The patient's FH and SH was reviewed, including their home life, tobacco status, and drug and alcohol status.   Vaccines: Tdap 2017,  COVID  x 3 Prostate Cancer  Screen: will start age 54 Colon : no family history      Smoking Status: former ETOH/ drug use: none/none  Hep C:  done  HIV screen:   screening needed.   Routine general medical examination at a health care facility  Acute renal insufficiency Assessment & Plan: Slight worsening in renal function over the last 3 months.  No clear additional cause apparent as he gets good water intake no clear sign of dehydration.  Blood pressure well-controlled. Will screen for diabetes. Check microalbumin creatinine ratio. No evidence of lithium toxicity on recent labs reviewed from psychiatrist, see MyChart attachment.  Possible long-term complication of lithium and I encouraged him if it continues to worsen on this recheck for him to discuss alternate with his psychiatrist.    Orders: -     Basic metabolic panel -     Comprehensive metabolic panel -     Microalbumin / creatinine urine ratio; Future  High risk medications (not anticoagulants) long-term use  Vitamin D deficiency -     VITAMIN D 25 Hydroxy (Vit-D Deficiency, Fractures)  Screening cholesterol level -     Lipid panel  Screen for STD (sexually transmitted disease) -     HIV Antibody (routine testing w rflx) -     RPR -     Herpes Simplex Virus 1 and 2 (IgG), with Reflex to HSV-2 Inhibition -     C. trachomatis/N. gonorrhoeae RNA -     Hepatitis panel, acute  Microalbuminuria  Screening for diabetes mellitus -     Hemoglobin A1c    Return in about 1 year (around 04/09/2023) for anuual physical with fasting labs prior.   Eliezer Lofts, MD

## 2022-04-09 NOTE — Assessment & Plan Note (Signed)
Slight worsening in renal function over the last 3 months.  No clear additional cause apparent as he gets good water intake no clear sign of dehydration.  Blood pressure well-controlled. Will screen for diabetes. Check microalbumin creatinine ratio. No evidence of lithium toxicity on recent labs reviewed from psychiatrist, see MyChart attachment.  Possible long-term complication of lithium and I encouraged him if it continues to worsen on this recheck for him to discuss alternate with his psychiatrist.

## 2022-04-10 ENCOUNTER — Encounter: Payer: Self-pay | Admitting: Family Medicine

## 2022-04-10 LAB — HEPATITIS PANEL, ACUTE
Hep A IgM: NONREACTIVE
Hep B C IgM: NONREACTIVE
Hepatitis B Surface Ag: NONREACTIVE
Hepatitis C Ab: NONREACTIVE

## 2022-04-10 LAB — COMPREHENSIVE METABOLIC PANEL
ALT: 16 U/L (ref 0–53)
AST: 18 U/L (ref 0–37)
Albumin: 4.6 g/dL (ref 3.5–5.2)
Alkaline Phosphatase: 65 U/L (ref 39–117)
BUN: 16 mg/dL (ref 6–23)
CO2: 27 mEq/L (ref 19–32)
Calcium: 9.4 mg/dL (ref 8.4–10.5)
Chloride: 106 mEq/L (ref 96–112)
Creatinine, Ser: 1.4 mg/dL (ref 0.40–1.50)
GFR: 61.62 mL/min (ref >60.00)
Glucose, Bld: 85 mg/dL (ref 70–99)
Potassium: 3.8 mEq/L (ref 3.5–5.1)
Sodium: 140 mEq/L (ref 135–145)
Total Bilirubin: 1 mg/dL (ref 0.2–1.2)
Total Protein: 7.5 g/dL (ref 6.0–8.3)

## 2022-04-10 LAB — HERPES SIMPLEX VIRUS 1 AND 2 (IGG),REFLEX HSV-2 INHIBITION
HAV 1 IGG,TYPE SPECIFIC AB: <0.9 index
HSV 2 IGG,TYPE SPECIFIC AB: <0.9 index

## 2022-04-10 LAB — BASIC METABOLIC PANEL
BUN: 16 mg/dL (ref 6–23)
CO2: 27 mEq/L (ref 19–32)
Calcium: 9.4 mg/dL (ref 8.4–10.5)
Chloride: 106 mEq/L (ref 96–112)
Creatinine, Ser: 1.4 mg/dL (ref 0.40–1.50)
GFR: 61.62 mL/min (ref >60.00)
Glucose, Bld: 85 mg/dL (ref 70–99)
Potassium: 3.8 mEq/L (ref 3.5–5.1)
Sodium: 140 mEq/L (ref 135–145)

## 2022-04-10 LAB — LIPID PANEL
Cholesterol: 200 mg/dL (ref 0–200)
HDL: 47.8 mg/dL (ref >39.00)
LDL Cholesterol: 139 mg/dL — ABNORMAL HIGH (ref 0–99)
NonHDL: 152.41
Total CHOL/HDL Ratio: 4
Triglycerides: 67 mg/dL (ref 0.0–149.0)
VLDL: 13.4 mg/dL (ref 0.0–40.0)

## 2022-04-10 LAB — C. TRACHOMATIS/N. GONORRHOEAE RNA
C. trachomatis RNA, TMA: NOT DETECTED
N. gonorrhoeae RNA, TMA: NOT DETECTED

## 2022-04-10 LAB — VITAMIN D 25 HYDROXY (VIT D DEFICIENCY, FRACTURES): VITD: 30.17 ng/mL (ref 30.00–100.00)

## 2022-04-10 LAB — RPR: RPR Ser Ql: NONREACTIVE

## 2022-04-10 LAB — HIV ANTIBODY (ROUTINE TESTING W REFLEX): HIV 1&2 Ab, 4th Generation: NONREACTIVE

## 2022-04-10 LAB — HEMOGLOBIN A1C: Hgb A1c MFr Bld: 5.6 % (ref 4.6–6.5)

## 2022-04-11 NOTE — Telephone Encounter (Signed)
Can you look into this for me.

## 2022-04-12 DIAGNOSIS — Z713 Dietary counseling and surveillance: Secondary | ICD-10-CM | POA: Diagnosis not present

## 2022-04-25 DIAGNOSIS — K080 Exfoliation of teeth due to systemic causes: Secondary | ICD-10-CM | POA: Diagnosis not present

## 2022-05-09 DIAGNOSIS — F3189 Other bipolar disorder: Secondary | ICD-10-CM | POA: Diagnosis not present

## 2022-05-13 DIAGNOSIS — K006 Disturbances in tooth eruption: Secondary | ICD-10-CM | POA: Diagnosis not present

## 2022-05-24 DIAGNOSIS — Z713 Dietary counseling and surveillance: Secondary | ICD-10-CM | POA: Diagnosis not present

## 2022-05-28 ENCOUNTER — Ambulatory Visit: Payer: Federal, State, Local not specified - PPO | Admitting: Family Medicine

## 2022-05-28 ENCOUNTER — Encounter: Payer: Self-pay | Admitting: Family Medicine

## 2022-05-28 VITALS — BP 118/74 | HR 85 | Temp 97.7°F | Ht 75.5 in | Wt 283.5 lb

## 2022-05-28 DIAGNOSIS — F1010 Alcohol abuse, uncomplicated: Secondary | ICD-10-CM | POA: Diagnosis not present

## 2022-05-28 DIAGNOSIS — K648 Other hemorrhoids: Secondary | ICD-10-CM | POA: Insufficient documentation

## 2022-05-28 DIAGNOSIS — K625 Hemorrhage of anus and rectum: Secondary | ICD-10-CM | POA: Insufficient documentation

## 2022-05-28 MED ORDER — HYDROCORTISONE ACETATE 25 MG RE SUPP: 25.0000 mg | Freq: Every day | RECTAL | 0 refills | Status: DC

## 2022-05-28 NOTE — Progress Notes (Signed)
Patient ID: Eric Faulkner, male    DOB: February 01, 1980, 42 y.o.   MRN: [id number]  This visit was conducted in person.  BP 118/74 (BP Location: Left Arm, Patient Position: Sitting, Cuff Size: Large)   Pulse 85   Temp 97.7 F (36.5 C) (Temporal)   Ht 6' 3.5" (1.918 m)   Wt 283 lb 8 oz (128.6 kg)   SpO2 97%   BMI 34.97 kg/m    CC:  Chief Complaint  Patient presents with   Blood In Stools    First noticed about a week ago    Subjective:   HPI: Eric Faulkner is a 42 y.o. male presenting on 05/28/2022 for Blood In Stools (First noticed about a week ago)  New onset blood in stool.  First noted 1 week ago. BRBPR  Drips into toilet, bright red, usual on toilet [paper.  No rectal soreness... noted swollen area rectall y in shower.  No constipation, looser stool.   Multiple BMs a day.  Has history of hemorrhoids.    He did drink up more alcohol at a wedding and in last 2 weeks.. had stopped  drinking for 120 day prior.   Colonoscopy: none in past.   Family history:;  no family histor yof colon cancer or IBD.     Relevant past medical, surgical, family and social history reviewed and updated as indicated. Interim medical history since our last visit reviewed. Allergies and medications reviewed and updated. Outpatient Medications Prior to Visit  Medication Sig Dispense Refill   lithium carbonate (LITHOBID) 300 MG CR tablet Take 900 mg by mouth at bedtime.     QUEtiapine (SEROQUEL) 50 MG tablet Take 50 mg by mouth at bedtime.     No facility-administered medications prior to visit.     Per HPI unless specifically indicated in ROS section below Review of Systems  Constitutional:  Negative for fatigue and fever.  HENT:  Negative for ear pain.   Eyes:  Negative for pain.  Respiratory:  Negative for cough and shortness of breath.   Cardiovascular:  Negative for chest pain, palpitations and leg swelling.  Gastrointestinal:  Positive for blood in stool and diarrhea.  Negative for abdominal distention, abdominal pain, constipation, nausea, rectal pain and vomiting.  Genitourinary:  Negative for dysuria.  Musculoskeletal:  Negative for arthralgias.  Neurological:  Negative for syncope, light-headedness and headaches.  Psychiatric/Behavioral:  Negative for dysphoric mood.    Objective:  BP 118/74 (BP Location: Left Arm, Patient Position: Sitting, Cuff Size: Large)   Pulse 85   Temp 97.7 F (36.5 C) (Temporal)   Ht 6' 3.5" (1.918 m)   Wt 283 lb 8 oz (128.6 kg)   SpO2 97%   BMI 34.97 kg/m   Wt Readings from Last 3 Encounters:  05/28/22 283 lb 8 oz (128.6 kg)  04/09/22 282 lb 8 oz (128.1 kg)  01/29/22 288 lb 2 oz (130.7 kg)      Physical Exam Constitutional:      Appearance: He is well-developed.  HENT:     Head: Normocephalic.     Right Ear: Hearing normal.     Left Ear: Hearing normal.     Nose: Nose normal.  Neck:     Thyroid: No thyroid mass or thyromegaly.     Vascular: No carotid bruit.     Trachea: Trachea normal.  Cardiovascular:     Rate and Rhythm: Normal rate and regular rhythm.     Pulses: Normal pulses.  Heart sounds: Heart sounds not distant. No murmur heard.    No friction rub. No gallop.     Comments: No peripheral edema Pulmonary:     Effort: Pulmonary effort is normal. No respiratory distress.     Breath sounds: Normal breath sounds.  Skin:    General: Skin is warm and dry.     Findings: No rash.  Psychiatric:        Speech: Speech normal.        Behavior: Behavior normal.        Thought Content: Thought content normal.   Procedure Note: After explaining the procedure, informed consent was obtained. verbally  Using the anoscope instrument, anoscopy was carried out.  Proceeded to 2-3 cm.  Findings: normal mucosa without polyps, tumors or diverticula, internal hemorrhoids noted, procedure well tolerated without complications.  No complications were encountered;  the procedure was well  tolerated.     ASSESSMENT:  normal anoscopy except internal non-bleeding hemorrhoids, no rectal mass      Results for orders placed or performed in visit on 04/09/22  C. trachomatis/N. gonorrhoeae RNA   Specimen: Blood  Result Value Ref Range   C. trachomatis RNA, TMA NOT DETECTED NOT DETECTED   N. gonorrhoeae RNA, TMA NOT DETECTED NOT DETECTED  Basic Metabolic Panel  Result Value Ref Range   Sodium 140 135 - 145 mEq/L   Potassium 3.8 3.5 - 5.1 mEq/L   Chloride 106 96 - 112 mEq/L   CO2 27 19 - 32 mEq/L   Glucose, Bld 85 70 - 99 mg/dL   BUN 16 6 - 23 mg/dL   Creatinine, Ser 4.09 0.40 - 1.50 mg/dL   GFR 81.19 >14.78 mL/min   Calcium 9.4 8.4 - 10.5 mg/dL  Lipid panel  Result Value Ref Range   Cholesterol 200 0 - 200 mg/dL   Triglycerides 29.5 0.0 - 149.0 mg/dL   HDL 62.13 >08.65 mg/dL   VLDL 78.4 0.0 - 69.6 mg/dL   LDL Cholesterol 295 (H) 0 - 99 mg/dL   Total CHOL/HDL Ratio 4    NonHDL 152.41   Comprehensive metabolic panel  Result Value Ref Range   Sodium 140 135 - 145 mEq/L   Potassium 3.8 3.5 - 5.1 mEq/L   Chloride 106 96 - 112 mEq/L   CO2 27 19 - 32 mEq/L   Glucose, Bld 85 70 - 99 mg/dL   BUN 16 6 - 23 mg/dL   Creatinine, Ser 2.84 0.40 - 1.50 mg/dL   Total Bilirubin 1.0 0.2 - 1.2 mg/dL   Alkaline Phosphatase 65 39 - 117 U/L   AST 18 0 - 37 U/L   ALT 16 0 - 53 U/L   Total Protein 7.5 6.0 - 8.3 g/dL   Albumin 4.6 3.5 - 5.2 g/dL   GFR 13.24 >40.10 mL/min   Calcium 9.4 8.4 - 10.5 mg/dL  Hemoglobin U7O  Result Value Ref Range   Hgb A1c MFr Bld 5.6 4.6 - 6.5 %  VITAMIN D 25 Hydroxy (Vit-D Deficiency, Fractures)  Result Value Ref Range   VITD 30.17 30.00 - 100.00 ng/mL  HIV Antibody (routine testing w rflx)  Result Value Ref Range   HIV 1&2 Ab, 4th Generation NON-REACTIVE NON-REACTIVE  RPR  Result Value Ref Range   RPR Ser Ql NON-REACTIVE NON-REACTIVE  Herpes Simplex Virus 1 and 2 (IgG), with Reflex to HSV-2 Inhibition  Result Value Ref Range   HAV 1 IGG,TYPE SPECIFIC AB  <0.90 index   HSV 2 IGG,TYPE  SPECIFIC AB <0.90 index  Hepatitis panel, acute  Result Value Ref Range   Hep A IgM NON-REACTIVE NON-REACTIVE   Hepatitis B Surface Ag NON-REACTIVE NON-REACTIVE   Hep B C IgM NON-REACTIVE NON-REACTIVE   Hepatitis C Ab NON-REACTIVE NON-REACTIVE    Assessment and Plan  BRBPR (bright red blood per rectum) Assessment & Plan: Acute onset, non-painful small amount of bright red per rectum most likely secondary to internal hemorrhoids seen on anoscopy. Will treat with topical steroid suppository for 6 days.  If no improvement will refer to gastroenterologist for further evaluation.  He does not have associated constipation, encouraged him to avoid alcohol use, push water and avoid straining with bowel movements.  No personal or family history or inflammatory bowel disease   Internal hemorrhoid Assessment & Plan: Acute Treat with rectal suppository Anusol HC 25 mg daily pr rectum     Alcohol abuse Assessment & Plan: Recurrent, he had gone 120 days, but has restarted drinking over the weekend. We discussed associated increased risk of health complications.  He plans to avoid alcohol use.   Other orders -     Hydrocortisone Acetate; Place 1 suppository (25 mg total) rectally daily.  Dispense: 6 suppository; Refill: 0    No follow-ups on file.   Kerby Nora, MD

## 2022-05-28 NOTE — Assessment & Plan Note (Addendum)
Recurrent, he had gone 120 days, but has restarted drinking over the weekend. We discussed associated increased risk of health complications.  He plans to avoid alcohol use.

## 2022-05-28 NOTE — Patient Instructions (Addendum)
Push water, avoid alcohol. Avoid straining. Use topical steroid suppository ( inset rectally) to decrease inflammation and irritation of hemorrhoids. Call for referral to GI if persistent bright red red blood per rectum.

## 2022-05-28 NOTE — Assessment & Plan Note (Signed)
Acute Treat with rectal suppository Anusol HC 25 mg daily pr rectum

## 2022-05-28 NOTE — Assessment & Plan Note (Signed)
Acute onset, non-painful small amount of bright red per rectum most likely secondary to internal hemorrhoids seen on anoscopy. Will treat with topical steroid suppository for 6 days.  If no improvement will refer to gastroenterologist for further evaluation.  He does not have associated constipation, encouraged him to avoid alcohol use, push water and avoid straining with bowel movements.  No personal or family history or inflammatory bowel disease

## 2022-05-30 DIAGNOSIS — K006 Disturbances in tooth eruption: Secondary | ICD-10-CM | POA: Diagnosis not present

## 2022-08-15 DIAGNOSIS — E559 Vitamin D deficiency, unspecified: Secondary | ICD-10-CM | POA: Diagnosis not present

## 2022-08-15 DIAGNOSIS — E8889 Other specified metabolic disorders: Secondary | ICD-10-CM | POA: Diagnosis not present

## 2022-08-15 DIAGNOSIS — F319 Bipolar disorder, unspecified: Secondary | ICD-10-CM | POA: Diagnosis not present

## 2022-08-15 DIAGNOSIS — Z1331 Encounter for screening for depression: Secondary | ICD-10-CM | POA: Diagnosis not present

## 2022-08-15 DIAGNOSIS — E782 Mixed hyperlipidemia: Secondary | ICD-10-CM | POA: Diagnosis not present

## 2022-08-15 DIAGNOSIS — Z79899 Other long term (current) drug therapy: Secondary | ICD-10-CM | POA: Diagnosis not present

## 2022-08-22 ENCOUNTER — Ambulatory Visit: Payer: Federal, State, Local not specified - PPO | Admitting: Family Medicine

## 2022-08-22 ENCOUNTER — Encounter: Payer: Self-pay | Admitting: Family Medicine

## 2022-08-22 VITALS — BP 122/82 | HR 85 | Temp 97.9°F | Ht 75.5 in | Wt 276.0 lb

## 2022-08-22 DIAGNOSIS — Z113 Encounter for screening for infections with a predominantly sexual mode of transmission: Secondary | ICD-10-CM | POA: Insufficient documentation

## 2022-08-22 DIAGNOSIS — U099 Post covid-19 condition, unspecified: Secondary | ICD-10-CM | POA: Insufficient documentation

## 2022-08-22 DIAGNOSIS — R053 Chronic cough: Secondary | ICD-10-CM | POA: Insufficient documentation

## 2022-08-22 NOTE — Assessment & Plan Note (Signed)
Acute, most likely postinfectious bronchospasm resulting in continued cough post-COVID.  Recommended symptomatic care and time.  Offered prednisone taper but given no shortness of breath and gradual improvement he has opted against this.  Return and ER precautions provided

## 2022-08-22 NOTE — Assessment & Plan Note (Signed)
No specific known exposure.  Requests STI testing today.

## 2022-08-22 NOTE — Progress Notes (Signed)
Patient ID: Eric Faulkner, male    DOB: 16-Jun-1980, 42 y.o.   MRN: [id number]  This visit was conducted in person.  BP 122/82   Pulse 85   Temp 97.9 F (36.6 C) (Temporal)   Ht 6' 3.5" (1.918 m)   Wt 276 lb (125.2 kg)   SpO2 98%   BMI 34.04 kg/m    CC:  Chief Complaint  Patient presents with   Cough    C/o lingering cough post-Covid [mid July and feeling of not being able to clear throat.    Subjective:   HPI: Eric Faulkner is a 42 y.o. male presenting on 08/22/2022 for Cough (C/o lingering cough post-Covid [mid July and feeling of not being able to clear throat.)  Dealt with COVID in July. Positive COVID test. Initial fatigue , ST, body aches, chills have resolved but he continues to have a lingering cough.  Has a sensation of not being able to clear his throat, postnasal drip. Dry cough now ongoing x 2 weeks.  Hypersensitivity of right upper teeth since COVID.   No recent fever  Achiness in  left hand.  DIP on pinky and  PIP on 2 nd digit... no redness , no swelling.  No SOB, no  wheeze.    Has taken 4 days of leftover doxycycline.     Has had recurrent blister on inner oral mucosa.. not at lip.  He is sexually active and would like STD screening.  He is currently being seen at a weight loss clinic and has had recent blood work that was within the normal range including thyroid.  He is working on regular exercise, healthy eating habits and weight management.  Relevant past medical, surgical, family and social history reviewed and updated as indicated. Interim medical history since our last visit reviewed. Allergies and medications reviewed and updated. Outpatient Medications Prior to Visit  Medication Sig Dispense Refill   lithium carbonate (LITHOBID) 300 MG CR tablet Take 900 mg by mouth at bedtime.     QUEtiapine (SEROQUEL) 50 MG tablet Take 50 mg by mouth at bedtime.     hydrocortisone (ANUSOL-HC) 25 MG suppository Place 1 suppository (25 mg total) rectally  daily. 6 suppository 0   No facility-administered medications prior to visit.     Per HPI unless specifically indicated in ROS section below Review of Systems  Constitutional:  Negative for fatigue and fever.  HENT:  Negative for ear pain and sinus pain.   Eyes:  Negative for pain.  Respiratory:  Negative for cough and shortness of breath.   Cardiovascular:  Negative for chest pain, palpitations and leg swelling.  Gastrointestinal:  Negative for abdominal pain.  Genitourinary:  Negative for dysuria.  Musculoskeletal:  Negative for arthralgias.  Neurological:  Negative for syncope, light-headedness and headaches.  Psychiatric/Behavioral:  Negative for dysphoric mood.    Objective:  BP 122/82   Pulse 85   Temp 97.9 F (36.6 C) (Temporal)   Ht 6' 3.5" (1.918 m)   Wt 276 lb (125.2 kg)   SpO2 98%   BMI 34.04 kg/m   Wt Readings from Last 3 Encounters:  08/22/22 276 lb (125.2 kg)  05/28/22 283 lb 8 oz (128.6 kg)  04/09/22 282 lb 8 oz (128.1 kg)      Physical Exam Constitutional:      Appearance: He is well-developed.  HENT:     Head: Normocephalic.     Comments: Shallow-based ulcer, healing right inner lip  Right Ear: Hearing normal.     Left Ear: Hearing normal.     Nose: Nose normal.  Neck:     Thyroid: No thyroid mass or thyromegaly.     Vascular: No carotid bruit.     Trachea: Trachea normal.  Cardiovascular:     Rate and Rhythm: Normal rate and regular rhythm.     Pulses: Normal pulses.     Heart sounds: Heart sounds not distant. No murmur heard.    No friction rub. No gallop.     Comments: No peripheral edema Pulmonary:     Effort: Pulmonary effort is normal. No respiratory distress.     Breath sounds: Normal breath sounds.  Skin:    General: Skin is warm and dry.     Findings: No rash.  Psychiatric:        Speech: Speech normal.        Behavior: Behavior normal.        Thought Content: Thought content normal.       Results for orders placed or  performed in visit on 04/09/22  C. trachomatis/N. gonorrhoeae RNA   Specimen: Blood  Result Value Ref Range   C. trachomatis RNA, TMA NOT DETECTED NOT DETECTED   N. gonorrhoeae RNA, TMA NOT DETECTED NOT DETECTED  Basic Metabolic Panel  Result Value Ref Range   Sodium 140 135 - 145 mEq/L   Potassium 3.8 3.5 - 5.1 mEq/L   Chloride 106 96 - 112 mEq/L   CO2 27 19 - 32 mEq/L   Glucose, Bld 85 70 - 99 mg/dL   BUN 16 6 - 23 mg/dL   Creatinine, Ser 4.16 0.40 - 1.50 mg/dL   GFR 60.63 >01.60 mL/min   Calcium 9.4 8.4 - 10.5 mg/dL  Lipid panel  Result Value Ref Range   Cholesterol 200 0 - 200 mg/dL   Triglycerides 10.9 0.0 - 149.0 mg/dL   HDL 32.35 >57.32 mg/dL   VLDL 20.2 0.0 - 54.2 mg/dL   LDL Cholesterol 706 (H) 0 - 99 mg/dL   Total CHOL/HDL Ratio 4    NonHDL 152.41   Comprehensive metabolic panel  Result Value Ref Range   Sodium 140 135 - 145 mEq/L   Potassium 3.8 3.5 - 5.1 mEq/L   Chloride 106 96 - 112 mEq/L   CO2 27 19 - 32 mEq/L   Glucose, Bld 85 70 - 99 mg/dL   BUN 16 6 - 23 mg/dL   Creatinine, Ser 2.37 0.40 - 1.50 mg/dL   Total Bilirubin 1.0 0.2 - 1.2 mg/dL   Alkaline Phosphatase 65 39 - 117 U/L   AST 18 0 - 37 U/L   ALT 16 0 - 53 U/L   Total Protein 7.5 6.0 - 8.3 g/dL   Albumin 4.6 3.5 - 5.2 g/dL   GFR 62.83 >15.17 mL/min   Calcium 9.4 8.4 - 10.5 mg/dL  Hemoglobin O1Y  Result Value Ref Range   Hgb A1c MFr Bld 5.6 4.6 - 6.5 %  VITAMIN D 25 Hydroxy (Vit-D Deficiency, Fractures)  Result Value Ref Range   VITD 30.17 30.00 - 100.00 ng/mL  HIV Antibody (routine testing w rflx)  Result Value Ref Range   HIV 1&2 Ab, 4th Generation NON-REACTIVE NON-REACTIVE  RPR  Result Value Ref Range   RPR Ser Ql NON-REACTIVE NON-REACTIVE  Herpes Simplex Virus 1 and 2 (IgG), with Reflex to HSV-2 Inhibition  Result Value Ref Range   HAV 1 IGG,TYPE SPECIFIC AB <0.90 index   HSV 2  IGG,TYPE SPECIFIC AB <0.90 index  Hepatitis panel, acute  Result Value Ref Range   Hep A IgM NON-REACTIVE  NON-REACTIVE   Hepatitis B Surface Ag NON-REACTIVE NON-REACTIVE   Hep B C IgM NON-REACTIVE NON-REACTIVE   Hepatitis C Ab NON-REACTIVE NON-REACTIVE    Assessment and Plan  Persistent cough Assessment & Plan: Acute, most likely postinfectious bronchospasm resulting in continued cough post-COVID.  Recommended symptomatic care and time.  Offered prednisone taper but given no shortness of breath and gradual improvement he has opted against this.  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  Majority of his symptoms are continuing to improve or resolve with time.   Screening examination for STI Assessment & Plan: No specific known exposure.  Requests STI testing today.  Orders: -     HIV Antibody (routine testing w rflx) -     C. trachomatis/N. gonorrhoeae RNA -     Herpes Simplex Virus 1 and 2 (IgG), with Reflex to HSV-2 Inhibition -     Hepatitis panel, acute -     RPR    No follow-ups on file.   Kerby Nora, MD

## 2022-08-22 NOTE — Assessment & Plan Note (Signed)
Acute, most of his symptoms including joint soreness hypersensitivity of teeth and lingering cough are likely due to post-COVID syndrome.  Majority of his symptoms are continuing to improve or resolve with time.

## 2022-08-23 ENCOUNTER — Encounter: Payer: Self-pay | Admitting: Family Medicine

## 2022-08-23 ENCOUNTER — Other Ambulatory Visit: Payer: Self-pay | Admitting: Family Medicine

## 2022-08-23 MED ORDER — PREDNISONE 10 MG PO TABS: ORAL_TABLET | ORAL | 0 refills | Status: DC

## 2022-08-29 DIAGNOSIS — E782 Mixed hyperlipidemia: Secondary | ICD-10-CM | POA: Diagnosis not present

## 2022-08-29 DIAGNOSIS — F319 Bipolar disorder, unspecified: Secondary | ICD-10-CM | POA: Diagnosis not present

## 2022-08-29 DIAGNOSIS — E6609 Other obesity due to excess calories: Secondary | ICD-10-CM | POA: Diagnosis not present

## 2022-08-29 DIAGNOSIS — E559 Vitamin D deficiency, unspecified: Secondary | ICD-10-CM | POA: Diagnosis not present

## 2022-08-30 ENCOUNTER — Ambulatory Visit: Payer: Federal, State, Local not specified - PPO | Admitting: Pulmonary Disease

## 2022-08-30 ENCOUNTER — Encounter: Payer: Self-pay | Admitting: Pulmonary Disease

## 2022-08-30 VITALS — BP 122/84 | HR 81 | Temp 98.1°F | Ht 75.5 in | Wt 281.2 lb

## 2022-08-30 DIAGNOSIS — J986 Disorders of diaphragm: Secondary | ICD-10-CM

## 2022-08-30 DIAGNOSIS — R0602 Shortness of breath: Secondary | ICD-10-CM | POA: Diagnosis not present

## 2022-08-30 DIAGNOSIS — G4733 Obstructive sleep apnea (adult) (pediatric): Secondary | ICD-10-CM

## 2022-08-30 NOTE — Progress Notes (Signed)
Subjective:    Patient ID: Eric Faulkner, male    DOB: [birth year], 42 y.o.   MRN: [id number]  Patient Care Team: Excell Seltzer, MD as PCP - General (Family Medicine)  Chief Complaint  Patient presents with   Follow-up    Doing fine. No SOB, wheezing or cough.   HPI The patient is a 42 year old former smoker (10 PY) who presents for follow-up of dyspnea worse on supine position due to elevated hemidiaphragm with decreased diaphragmatic motion.  This issue occurred after chiropractic manipulation of the C-spine.  He was last seen on 20 September 2021.  At that time he was symptomatically improved from his shortness of breath.  In the process of working the elevated right hemidiaphragm it was noted that he had significant C-spine stenosis but he has declined neurosurgery for this.  He has mild sleep apnea this is AHI 5.2) and has declined CPAP opting for weight loss, avoiding alcohol and avoiding supine position.  Since his prior visit his shortness of breath has now resolved. He does not notice the dyspnea standing up or sitting upright. He has had no fevers, chills or sweats.  No other symptomatology. No weight loss or anorexia.  No cough or sputum production.  He has engaged in an exercise program.  Overall feels that he is improving.Patient does not endorse any other new symptomatology.  He has been doing well, he has been curtailing his alcohol intake.   He had a chest x-ray in January 2024 during an ED visit for chest pain.  There is martial elevation of the right hemidiaphragm with lung volume on the right being significantly better than prior.  I suspect that he has had partial return of function of that hemidiaphragm.  I reviewed all of the films with the patient.  DATA 12/05/2020 chest x-ray: Significant elevation of the right hemidiaphragm 12/13/2020 sniff test: Normal left diaphragmatic motion, decreased overall right diaphragmatic motion compared to left but not complete  paralysis 12/28/2020 CT coronary: Normal coronaries with calcium score of 0. 02/02/2021 PFTs: Moderate restriction, no obstruction, no bronchodilator response.  There is significant decrease in slow vital capacity when compared to sitting versus supine positions.  This is consistent with diaphragmatic dysfunction.  Diffusion capacity normal when corrected for Kco 02/09/2021 CT chest abdomen and pelvis: Elevation of the right hemidiaphragm, normal left hemidiaphragm.   Atelectasis of the right lower lobe associated with elevation of the right hemidiaphragm.  No focal lesions noted, no explanation for diaphragm elevation.  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  Moderate to severe bilateral C4 neural foraminal stenosis greater on the left.  Up to moderate foraminal stenosis at the left C6 and bilateral C7 nerve levels. 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 05/29/2021 sleep study: Mild sleep apnea AHI of 5.2, did not qualify for split night study, did have frequent PVCs initially which resolved with doors the study.  Oxygen saturations never below 86%. 06/18/2021 chest x-ray: No significant change from previous showing elevation of the right hemidiaphragm.  Review of Systems A 10 point review of systems was performed and it is as noted above  otherwise negative.   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 Cutting back further.    No Known Allergies  Current Meds  Medication Sig   lithium carbonate (LITHOBID) 300 MG CR tablet Take 900 mg by mouth at bedtime.   QUEtiapine (SEROQUEL) 50 MG tablet Take 50 mg by mouth at bedtime.    Immunization History  Administered Date(s) Administered   PFIZER(Purple Top)SARS-COV-2 Vaccination 10/22/2019, 11/01/2019, 01/26/2020   Tdap 07/11/2015      Objective:     BP 122/84 (BP Location: Left Arm, Cuff Size: Large)   Pulse 81   Temp 98.1 F (36.7 C)   Ht 6' 3.5" (1.918 m)   Wt 281 lb 3.2 oz (127.6 kg)   SpO2 97%   BMI 34.68 kg/m   SpO2: 97 % O2 Device: None (Room air)  GENERAL: Well-nourished, well-developed gentleman, no acute distress.  Fully ambulatory.  No conversational dyspnea, very articulate. HEAD: Normocephalic, atraumatic.  EYES: Pupils equal, round, reactive to light.  No scleral icterus.  MOUTH: Nose/mouth/throat not examined due to masking requirements for COVID 19. NECK: Supple. No thyromegaly. Trachea midline. No JVD.  No adenopathy. PULMONARY: Good air entry bilaterally.  Diminished breath sounds on the right base.  CARDIOVASCULAR: S1 and S2. Regular rate and rhythm.  No rubs, murmurs or gallops heard. ABDOMEN: Benign. MUSCULOSKELETAL: No joint deformity, no clubbing, no edema.  NEUROLOGIC: No focal deficit, no  gait disturbance, speech is fluent. SKIN: Intact,warm,dry. PSYCH: Mood and behavior normal.     Assessment & Plan:     ICD-10-CM   1. 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 SOB (shortness of breath)  R06.02    Resolved    3. OSA (obstructive sleep apnea) - mild  G47.33    Mild, AHI 5.2 Not on CPAP Engaging in weight loss Positional therapy     Patient appears to be doing well.  He appears to have regained partial function of his right hemidiaphragm and his symptoms of shortness of breath have resolved.  We will see him in follow-up on an as-needed basis.   Gailen Shelter, MD Advanced Bronchoscopy [facility]    *This note was dictated using voice recognition software/Dragon.  Despite best efforts to proofread, errors can occur which can change the meaning. Any transcriptional errors that result from this process are unintentional and may not be fully corrected at the time of dictation.

## 2022-08-30 NOTE — Patient Instructions (Signed)
Great news your diaphragm has regained partial function.  Your lungs sounded clear today.  We will see you in follow-up on an as-needed basis.  Do not hesitate to call if you develop any further issues with your breathing.

## 2022-09-09 ENCOUNTER — Ambulatory Visit: Payer: Federal, State, Local not specified - PPO | Admitting: Pulmonary Disease

## 2022-09-11 DIAGNOSIS — L732 Hidradenitis suppurativa: Secondary | ICD-10-CM | POA: Diagnosis not present

## 2022-09-11 DIAGNOSIS — L910 Hypertrophic scar: Secondary | ICD-10-CM | POA: Diagnosis not present

## 2022-09-12 ENCOUNTER — Encounter: Payer: Self-pay | Admitting: Family Medicine

## 2022-09-12 ENCOUNTER — Telehealth: Payer: Federal, State, Local not specified - PPO | Admitting: Family Medicine

## 2022-09-12 VITALS — Ht 75.5 in

## 2022-09-12 DIAGNOSIS — F319 Bipolar disorder, unspecified: Secondary | ICD-10-CM | POA: Diagnosis not present

## 2022-09-12 DIAGNOSIS — E559 Vitamin D deficiency, unspecified: Secondary | ICD-10-CM | POA: Diagnosis not present

## 2022-09-12 DIAGNOSIS — R519 Headache, unspecified: Secondary | ICD-10-CM | POA: Diagnosis not present

## 2022-09-12 DIAGNOSIS — E6609 Other obesity due to excess calories: Secondary | ICD-10-CM | POA: Diagnosis not present

## 2022-09-12 DIAGNOSIS — E782 Mixed hyperlipidemia: Secondary | ICD-10-CM | POA: Diagnosis not present

## 2022-09-12 MED ORDER — AMOXICILLIN-POT CLAVULANATE 875-125 MG PO TABS: 1.0000 | ORAL_TABLET | Freq: Two times a day (BID) | ORAL | 0 refills | Status: DC

## 2022-09-12 MED ORDER — GABAPENTIN 100 MG PO CAPS: 100.0000 mg | ORAL_CAPSULE | Freq: Every day | ORAL | 0 refills | Status: DC

## 2022-09-12 NOTE — Assessment & Plan Note (Signed)
Acute, following COVID. Temporary improvement with prednisone.  Previously self treated with doxycycline low-dose for 4 days, no improvement. Possible bacterial superinfection in his sinus cavity but symptoms are not exactly typical without copious green nasal discharge, fever. Will treat with broadened antibiotics Augmentin x 10 days. Alternative diagnosis could be COVID associated trigeminal neuralgia. Will treat with gabapentin 100 mg p.o. nightly at bedtime.  If symptoms not improving we can move forward with further evaluation for trigeminal neuralgia with neurology referral and possible up titration of gabapentin.

## 2022-09-12 NOTE — Progress Notes (Signed)
VIRTUAL VISIT A virtual visit is felt to be most appropriate for this patient at this time.   I connected with the patient on 09/12/22 at 12:00 PM EDT by virtual telehealth platform and verified that I am speaking with the correct person using two identifiers.   I discussed the limitations, risks, security and privacy concerns of performing an evaluation and management service by  virtual telehealth platform and the availability of in person appointments. I also discussed with the patient that there may be a patient responsible charge related to this service. The patient expressed understanding and agreed to proceed.  Patient location: Home Provider Location: [facility] Participants: Kerby Nora and Linna Caprice   Chief Complaint  Patient presents with   Oral Pain    Still experiencing Radiating Discomfort Upper Teeth    History of Present Illness:  42 y.o. male patient of Jerra Huckeby E, MD presents with oral pain  COVID in July 2024  Since that time has noted hypersensitivity at the upper teeth Recurrent blisters on inner lower oral mucosa.. now resolved. HSV testing both 1 and 2 IgG negative Lingering cough resolved.   Now with pain in right maxillary sinus. Radiates to right  frontal sinus  Some  clear nasal discharge.  No fever. No gum soreness  No ear pain.  No ST.   Prednisone helps some but now returned worse.   Has appt with dentist next week.  Followed by psychiatry for bipolar.  COVID 19 screen No recent travel or known exposure to COVID19 The patient denies respiratory symptoms of COVID 19 at this time.  The importance of social distancing was discussed today.   Review of Systems  Constitutional:  Negative for chills and fever.  HENT:  Positive for sinus pain. Negative for congestion and ear pain.   Eyes:  Negative for pain and redness.  Respiratory:  Negative for cough and shortness of breath.   Cardiovascular:  Negative for chest pain,  palpitations and leg swelling.  Gastrointestinal:  Negative for abdominal pain, blood in stool, constipation, diarrhea, nausea and vomiting.  Genitourinary:  Negative for dysuria.  Musculoskeletal:  Negative for falls and myalgias.  Skin:  Negative for rash.  Neurological:  Negative for dizziness.  Psychiatric/Behavioral:  Negative for depression. The patient is not nervous/anxious.       Past Medical History:  Diagnosis Date   Low back pain     reports that he has quit smoking. His smoking use included cigars. He has never used smokeless tobacco. He reports current alcohol use. He reports that he does not use drugs.   Current Outpatient Medications:    amoxicillin-clavulanate (AUGMENTIN) 875-125 MG tablet, Take 1 tablet by mouth 2 (two) times daily., Disp: 20 tablet, Rfl: 0   gabapentin (NEURONTIN) 100 MG capsule, Take 1 capsule (100 mg total) by mouth at bedtime., Disp: 30 capsule, Rfl: 0   lithium carbonate (LITHOBID) 300 MG CR tablet, Take 900 mg by mouth at bedtime., Disp: , Rfl:    QUEtiapine (SEROQUEL) 50 MG tablet, Take 50 mg by mouth at bedtime., Disp: , Rfl:    Observations/Objective: Height 6' 3.5" (1.918 m).  Physical Exam Constitutional:      General: The patient is not in acute distress. Pulmonary:     Effort: Pulmonary effort is normal. No respiratory distress.  Neurological:     Mental Status: The patient is alert and oriented to person, place, and time.  Psychiatric:  Mood and Affect: Mood normal.        Behavior: Behavior normal.    Assessment and Plan Right sided facial pain Assessment & Plan: Acute, following COVID. Temporary improvement with prednisone.  Previously self treated with doxycycline low-dose for 4 days, no improvement. Possible bacterial superinfection in his sinus cavity but symptoms are not exactly typical without copious green nasal discharge, fever. Will treat with broadened antibiotics Augmentin x 10 days. 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 Will treat with gabapentin 100 mg p.o. nightly at bedtime.  If symptoms not improving we can move forward with further evaluation for trigeminal neuralgia with neurology referral and possible up titration of gabapentin.   Other orders -     Amoxicillin-Pot Clavulanate; Take 1 tablet by mouth 2 (two) times daily.  Dispense: 20 tablet; Refill: 0 -     Gabapentin; Take 1 capsule (100 mg total) by mouth at bedtime.  Dispense: 30 capsule; Refill: 0      I discussed the assessment and treatment plan with the patient. The patient was provided an opportunity to ask questions and all were answered. The patient agreed with the plan and demonstrated an understanding of the instructions.   The patient was advised to call back or seek an in-person evaluation if the symptoms worsen or if the condition fails to improve as anticipated.     Kerby Nora, MD

## 2022-10-02 DIAGNOSIS — Z0121 Encounter for dental examination and cleaning with abnormal findings: Secondary | ICD-10-CM | POA: Diagnosis not present

## 2022-10-10 DIAGNOSIS — K0490 Unspecified diseases of pulp and periapical tissues: Secondary | ICD-10-CM | POA: Diagnosis not present

## 2022-11-07 ENCOUNTER — Encounter: Payer: Self-pay | Admitting: Family Medicine

## 2022-11-07 ENCOUNTER — Ambulatory Visit: Payer: Federal, State, Local not specified - PPO | Admitting: Family Medicine

## 2022-11-07 VITALS — BP 120/70 | HR 76 | Temp 98.8°F | Ht 75.5 in | Wt 275.5 lb

## 2022-11-07 DIAGNOSIS — M722 Plantar fascial fibromatosis: Secondary | ICD-10-CM | POA: Diagnosis not present

## 2022-11-07 DIAGNOSIS — B356 Tinea cruris: Secondary | ICD-10-CM | POA: Diagnosis not present

## 2022-11-07 DIAGNOSIS — M7702 Medial epicondylitis, left elbow: Secondary | ICD-10-CM | POA: Diagnosis not present

## 2022-11-07 MED ORDER — TERBINAFINE HCL 250 MG PO TABS: 250.0000 mg | ORAL_TABLET | Freq: Every day | ORAL | 0 refills | Status: DC

## 2022-11-07 MED ORDER — DICLOFENAC SODIUM 75 MG PO TBEC: 75.0000 mg | DELAYED_RELEASE_TABLET | Freq: Two times a day (BID) | ORAL | 0 refills | Status: DC

## 2022-11-07 NOTE — Patient Instructions (Addendum)
Start home PT for plantar fasciitis and left  arm.  Ice massage of foot twice daily.  Start diclofenac 75 mg twice daily.  For groin rash.. treat with terbinafine 250 mg daily x 2 weeks.. call if not improving for consideration of alternate diagnosis.

## 2022-11-07 NOTE — Assessment & Plan Note (Signed)
Acute, possible tinea infection in left groin.  He has failed over-the-counter topical treatment use consistently for more than 2 weeks.  Will treat with terbinafine 250 mg p.o. daily x 2 weeks.  If minimal benefit we will consider alternative diagnosis.  Patient states he has been told there is a possibility of a psoriasis like plaques that can develop with lithium use and he wonders if this could be causing the problem. If there is no improvement with rash with treatment for fungal infection we can try a topical steroid cream and referral to dermatology.

## 2022-11-07 NOTE — Assessment & Plan Note (Signed)
Acute, symptoms most likely secondary to medial epicondylitis.  Will treat with diclofenac 75 mg p.o. twice daily, information given on home physical therapy.  Can ice area.  Recommended avoiding repetitive motion with left arm.

## 2022-11-07 NOTE — Progress Notes (Signed)
Patient ID: Eric Faulkner, male    DOB: October 23, 1980, 42 y.o.   MRN: [id number]  This visit was conducted in person.  BP 120/70 (BP Location: Right Arm, Patient Position: Sitting, Cuff Size: Large)   Pulse 76   Temp 98.8 F (37.1 C) (Temporal)   Ht 6' 3.5" (1.918 m)   Wt 275 lb 8 oz (125 kg)   SpO2 98%   BMI 33.98 kg/m    CC:  Chief Complaint  Patient presents with   Arm Pain    Left    Itching in Groin Area   Plantar Fasciitis    Left Foot    Subjective:   HPI: Eric Faulkner is a 42 y.o. male presenting on 11/07/2022 for Arm Pain (Left/), Itching in Groin Area, and Plantar Fasciitis (Left Foot)   Pt presents with multiple issues  Left arm pain started after cleaning extensively in left albow, medial  2. Itching in groin, on left groin crease.... has noted in  last 1-2 months.  Skin is white, no discharge. Shin bumpy and red on left groin.   Has been using tinactin, antifungal power, cortaid,.. no help. NO toher skin rash.   Lithium he is on can cause  psoriasis.  3.  Left heel pain.Marland Kitchen stopped walking, to stop aggravation.        Relevant past medical, surgical, family and social history reviewed and updated as indicated. Interim medical history since our last visit reviewed. Allergies and medications reviewed and updated. Outpatient Medications Prior to Visit  Medication Sig Dispense Refill   lithium carbonate (LITHOBID) 300 MG CR tablet Take 900 mg by mouth at bedtime.     QUEtiapine (SEROQUEL) 50 MG tablet Take 50 mg by mouth at bedtime.     amoxicillin-clavulanate (AUGMENTIN) 875-125 MG tablet Take 1 tablet by mouth 2 (two) times daily. 20 tablet 0   gabapentin (NEURONTIN) 100 MG capsule Take 1 capsule (100 mg total) by mouth at bedtime. 30 capsule 0   No facility-administered medications prior to visit.     Per HPI unless specifically indicated in ROS section below Review of Systems  Constitutional:  Negative for fatigue and fever.  HENT:   Negative for ear pain.   Eyes:  Negative for pain.  Respiratory:  Negative for cough and shortness of breath.   Cardiovascular:  Negative for chest pain, palpitations and leg swelling.  Gastrointestinal:  Negative for abdominal pain.  Genitourinary:  Negative for dysuria.  Musculoskeletal:  Positive for arthralgias.  Neurological:  Negative for syncope, light-headedness and headaches.  Psychiatric/Behavioral:  Negative for dysphoric mood.    Objective:  BP 120/70 (BP Location: Right Arm, Patient Position: Sitting, Cuff Size: Large)   Pulse 76   Temp 98.8 F (37.1 C) (Temporal)   Ht 6' 3.5" (1.918 m)   Wt 275 lb 8 oz (125 kg)   SpO2 98%   BMI 33.98 kg/m   Wt Readings from Last 3 Encounters:  11/07/22 275 lb 8 oz (125 kg)  08/30/22 281 lb 3.2 oz (127.6 kg)  08/22/22 276 lb (125.2 kg)      Physical Exam Constitutional:      Appearance: He is well-developed.  HENT:     Head: Normocephalic.     Right Ear: Hearing normal.     Left Ear: Hearing normal.     Nose: Nose normal.  Neck:     Thyroid: No thyroid mass or thyromegaly.     Vascular: No carotid bruit.  Trachea: Trachea normal.  Cardiovascular:     Rate and Rhythm: Normal rate and regular rhythm.     Pulses: Normal pulses.     Heart sounds: Heart sounds not distant. No murmur heard.    No friction rub. No gallop.     Comments: No peripheral edema Pulmonary:     Effort: Pulmonary effort is normal. No respiratory distress.     Breath sounds: Normal breath sounds.  Genitourinary:    Comments: Patient showed pictures on his phone instead of doing in person genital exam.  Noted thickened paler skin in the left groin crease, nowhere else. Musculoskeletal:     Comments: Tender to palpation over right anterior heel at insertion of plantar fascia Tenderness to palpation on medial lower extremity 1 inch below medial left epicondyle  Skin:    General: Skin is warm and dry.     Findings: No rash.  Psychiatric:         Speech: Speech normal.        Behavior: Behavior normal.        Thought Content: Thought content normal.       Results for orders placed or performed in visit on 08/22/22  C. trachomatis/N. gonorrhoeae RNA   Specimen: Blood  Result Value Ref Range   C. trachomatis RNA, TMA NOT DETECTED NOT DETECTED   N. gonorrhoeae RNA, TMA NOT DETECTED NOT DETECTED  HIV Antibody (routine testing w rflx)  Result Value Ref Range   HIV 1&2 Ab, 4th Generation NON-REACTIVE NON-REACTIVE  Herpes Simplex Virus 1 and 2 (IgG), with Reflex to HSV-2 Inhibition  Result Value Ref Range   HAV 1 IGG,TYPE SPECIFIC AB <0.90 index   HSV 2 IGG,TYPE SPECIFIC AB <0.90 index  Hepatitis panel, acute  Result Value Ref Range   Hep A IgM NON-REACTIVE NON-REACTIVE   Hepatitis B Surface Ag NON-REACTIVE NON-REACTIVE   Hep B C IgM NON-REACTIVE NON-REACTIVE   Hepatitis C Ab NON-REACTIVE NON-REACTIVE  RPR  Result Value Ref Range   RPR Ser Ql NON-REACTIVE NON-REACTIVE    Assessment and Plan  Medial epicondylitis of elbow, left Assessment & Plan: Acute, symptoms most likely secondary to medial epicondylitis.  Will treat with diclofenac 75 mg p.o. twice daily, information given on home physical therapy.  Can ice area.  Recommended avoiding repetitive motion with left arm.   Tinea cruris Assessment & Plan: Acute, possible tinea infection in left groin.  He has failed over-the-counter topical treatment use consistently for more than 2 weeks.  Will treat with terbinafine 250 mg p.o. daily x 2 weeks.  If minimal benefit we will consider alternative diagnosis.  Patient states he has been told there is a possibility of a psoriasis like plaques that can develop with lithium use and he wonders if this could be causing the problem. If there is no improvement with rash with treatment for fungal infection we can try a topical steroid cream and referral to dermatology.   Plantar fasciitis, left Assessment & Plan: Acute, symptoms most  consistent with plantars fasciitis.  Encouraged patient to work on home physical therapy, ice massage.  Information given.  He will also use diclofenac 75 mg p.o. twice daily x 2 weeks.  Next line discussed continue to wear insole and if symptoms are persistent you can follow-up with sports medicine Dr. Patsy Lager.   Other orders -     Diclofenac Sodium; Take 1 tablet (75 mg total) by mouth 2 (two) times daily.  Dispense: 30 tablet; Refill: 0 -  Terbinafine HCl; Take 1 tablet (250 mg total) by mouth daily.  Dispense: 14 tablet; Refill: 0    No follow-ups on file.   Kerby Nora, MD

## 2022-11-07 NOTE — Assessment & Plan Note (Signed)
Acute, symptoms most consistent with plantars fasciitis.  Encouraged patient to work on home physical therapy, ice massage.  Information given.  He will also use diclofenac 75 mg p.o. twice daily x 2 weeks.  Next line discussed continue to wear insole and if symptoms are persistent you can follow-up with sports medicine Dr. Patsy Lager.

## 2022-11-19 DIAGNOSIS — K041 Necrosis of pulp: Secondary | ICD-10-CM | POA: Diagnosis not present

## 2022-11-24 NOTE — Progress Notes (Unsigned)
    Eric Vanbenschoten [name], MD, CAQ Sports Medicine [facility] [address] [place], [address]  Phone: [phone]  FAX: [phone]  [name] - 42 y.o. male  MRN [id number]  Date of Birth: 08/01/1980  Date: 11/25/2022  PCP: Excell Seltzer, MD  Referral: Excell Seltzer, MD  No chief complaint on file.  Subjective:   Eric Faulkner is a 42 y.o. very pleasant male patient with There is no height or weight on file to calculate BMI. who presents with the following:  Very pleasant patient seen at the request of Dr. Ermalene Searing for evaluation of ongoing plantar fasciitis.  At the time of his office visit 2 weeks ago, he was also having some medial epicondylitis.    Review of Systems is noted in the HPI, as appropriate  Objective:   There were no vitals taken for this visit.  GEN: No acute distress; alert,appropriate. PULM: Breathing comfortably in no respiratory distress PSYCH: Normally interactive.   Laboratory and Imaging Data:  Assessment and Plan:   ***

## 2022-11-25 ENCOUNTER — Ambulatory Visit: Payer: Federal, State, Local not specified - PPO | Admitting: Family Medicine

## 2022-11-25 VITALS — BP 120/74 | HR 84 | Temp 98.4°F | Ht 75.5 in | Wt 278.2 lb

## 2022-11-25 DIAGNOSIS — M722 Plantar fascial fibromatosis: Secondary | ICD-10-CM | POA: Diagnosis not present

## 2022-11-25 DIAGNOSIS — M7702 Medial epicondylitis, left elbow: Secondary | ICD-10-CM

## 2022-11-25 NOTE — Patient Instructions (Signed)
Stretch   STRETCHING and Strengthening program critically important.  Strengthening on foot and calf muscles as seen in handout. Calf raises, 2 legged, then 1 legged. Foot massage with tennis ball. Ice massage.  Towel Scrunches: get a towel or hand towel, use toes to pick up and scrunch up the towel.  Marble pick-ups, practice picking up marbles with toes and placing into a cup  NEEDS TO BE DONE EVERY DAY  Recommended over the counter insoles. (Spenco or Hapad)  A rigid shoe with good arch support helps: Dansko (great), Randel Pigg, Merrell No easily bendable shoes.   Tuli's heel cups - I like the ones that you already have

## 2022-11-26 ENCOUNTER — Encounter: Payer: Self-pay | Admitting: Family Medicine

## 2022-12-23 DIAGNOSIS — K0381 Cracked tooth: Secondary | ICD-10-CM | POA: Diagnosis not present

## 2023-01-30 DIAGNOSIS — F3189 Other bipolar disorder: Secondary | ICD-10-CM | POA: Diagnosis not present

## 2023-02-04 DIAGNOSIS — K029 Dental caries, unspecified: Secondary | ICD-10-CM | POA: Diagnosis not present

## 2023-02-04 DIAGNOSIS — K0381 Cracked tooth: Secondary | ICD-10-CM | POA: Diagnosis not present

## 2023-03-10 ENCOUNTER — Telehealth: Payer: Self-pay | Admitting: *Deleted

## 2023-03-10 DIAGNOSIS — R809 Proteinuria, unspecified: Secondary | ICD-10-CM

## 2023-03-10 DIAGNOSIS — N289 Disorder of kidney and ureter, unspecified: Secondary | ICD-10-CM

## 2023-03-10 DIAGNOSIS — E559 Vitamin D deficiency, unspecified: Secondary | ICD-10-CM

## 2023-03-10 DIAGNOSIS — Z131 Encounter for screening for diabetes mellitus: Secondary | ICD-10-CM

## 2023-03-10 DIAGNOSIS — Z13220 Encounter for screening for lipoid disorders: Secondary | ICD-10-CM

## 2023-03-10 NOTE — Telephone Encounter (Signed)
-----   Message from Alvina Chou sent at 03/10/2023  3:01 PM EST ----- Regarding: Lab orders for TUE, 3.18.25 Patient is scheduled for CPX labs, please order future labs, Thanks , Camelia Eng

## 2023-03-11 DIAGNOSIS — K029 Dental caries, unspecified: Secondary | ICD-10-CM | POA: Diagnosis not present

## 2023-04-03 ENCOUNTER — Other Ambulatory Visit (INDEPENDENT_AMBULATORY_CARE_PROVIDER_SITE_OTHER): Payer: Federal, State, Local not specified - PPO

## 2023-04-03 ENCOUNTER — Encounter: Payer: Self-pay | Admitting: Family Medicine

## 2023-04-03 DIAGNOSIS — N289 Disorder of kidney and ureter, unspecified: Secondary | ICD-10-CM

## 2023-04-03 DIAGNOSIS — R809 Proteinuria, unspecified: Secondary | ICD-10-CM

## 2023-04-03 DIAGNOSIS — Z131 Encounter for screening for diabetes mellitus: Secondary | ICD-10-CM | POA: Diagnosis not present

## 2023-04-03 DIAGNOSIS — Z13220 Encounter for screening for lipoid disorders: Secondary | ICD-10-CM

## 2023-04-03 DIAGNOSIS — E559 Vitamin D deficiency, unspecified: Secondary | ICD-10-CM | POA: Diagnosis not present

## 2023-04-03 LAB — LIPID PANEL
Cholesterol: 205 mg/dL — ABNORMAL HIGH (ref 0–200)
HDL: 53.9 mg/dL (ref >39.00)
LDL Cholesterol: 137 mg/dL — ABNORMAL HIGH (ref 0–99)
NonHDL: 151.59
Total CHOL/HDL Ratio: 4
Triglycerides: 74 mg/dL (ref 0.0–149.0)
VLDL: 14.8 mg/dL (ref 0.0–40.0)

## 2023-04-03 LAB — MICROALBUMIN / CREATININE URINE RATIO
Creatinine,U: 128.6 mg/dL
Microalb Creat Ratio: 15 mg/g (ref 0.0–30.0)
Microalb, Ur: 1.9 mg/dL (ref 0.0–1.9)

## 2023-04-03 LAB — VITAMIN D 25 HYDROXY (VIT D DEFICIENCY, FRACTURES): VITD: 28.38 ng/mL — ABNORMAL LOW (ref 30.00–100.00)

## 2023-04-03 LAB — COMPREHENSIVE METABOLIC PANEL
ALT: 19 U/L (ref 0–53)
AST: 16 U/L (ref 0–37)
Albumin: 4.7 g/dL (ref 3.5–5.2)
Alkaline Phosphatase: 71 U/L (ref 39–117)
BUN: 14 mg/dL (ref 6–23)
CO2: 28 meq/L (ref 19–32)
Calcium: 9.7 mg/dL (ref 8.4–10.5)
Chloride: 107 meq/L (ref 96–112)
Creatinine, Ser: 1.25 mg/dL (ref 0.40–1.50)
GFR: 70.12 mL/min (ref >60.00)
Glucose, Bld: 96 mg/dL (ref 70–99)
Potassium: 4.2 meq/L (ref 3.5–5.1)
Sodium: 142 meq/L (ref 135–145)
Total Bilirubin: 0.6 mg/dL (ref 0.2–1.2)
Total Protein: 7.3 g/dL (ref 6.0–8.3)

## 2023-04-03 LAB — HEMOGLOBIN A1C: Hgb A1c MFr Bld: 5.3 % (ref 4.6–6.5)

## 2023-04-03 NOTE — Progress Notes (Signed)
 No critical labs need to be addressed urgently. We will discuss labs in detail at upcoming office visit.

## 2023-04-10 ENCOUNTER — Ambulatory Visit (INDEPENDENT_AMBULATORY_CARE_PROVIDER_SITE_OTHER): Payer: Federal, State, Local not specified - PPO | Admitting: Family Medicine

## 2023-04-10 ENCOUNTER — Encounter: Payer: Self-pay | Admitting: Family Medicine

## 2023-04-10 VITALS — BP 118/80 | HR 81 | Temp 98.1°F | Ht 75.6 in | Wt 276.2 lb

## 2023-04-10 DIAGNOSIS — F1010 Alcohol abuse, uncomplicated: Secondary | ICD-10-CM

## 2023-04-10 DIAGNOSIS — F310 Bipolar disorder, current episode hypomanic: Secondary | ICD-10-CM

## 2023-04-10 DIAGNOSIS — Z0000 Encounter for general adult medical examination without abnormal findings: Secondary | ICD-10-CM | POA: Diagnosis not present

## 2023-04-10 DIAGNOSIS — N289 Disorder of kidney and ureter, unspecified: Secondary | ICD-10-CM

## 2023-04-10 DIAGNOSIS — Z79899 Other long term (current) drug therapy: Secondary | ICD-10-CM

## 2023-04-10 DIAGNOSIS — E559 Vitamin D deficiency, unspecified: Secondary | ICD-10-CM | POA: Diagnosis not present

## 2023-04-10 MED ORDER — VITAMIN D3 1.25 MG (50000 UT) PO CAPS: 1.0000 | ORAL_CAPSULE | ORAL | 0 refills | Status: DC

## 2023-04-10 NOTE — Assessment & Plan Note (Signed)
 Chronic, replace with high-dose vitamin D course x 12 weeks then follow with 2000 international units daily.  He can consider 5000 international units once a day over the winter months in the future.

## 2023-04-10 NOTE — Assessment & Plan Note (Signed)
 Chronic, well-controlled on lithium and Seroquel per patient.  Followed by [facility] psychiatrist.  Looking into [facility] psychologist setting up this again. Reviewed documentation regarding symptoms that interfere with working outside of the home.  Forms completed in detail.

## 2023-04-10 NOTE — Patient Instructions (Addendum)
 Complete high dose vit D then follow with 2000  daily OTC.  Please stop at the lab to have labs drawn.

## 2023-04-10 NOTE — Progress Notes (Signed)
 Patient ID: Eric Faulkner, male    DOB: 17-Nov-1980, 43 y.o.   MRN: [id number]  This visit was conducted in person.  BP 118/80 (BP Location: Left Arm, Patient Position: Sitting, Cuff Size: Large)   Pulse 81   Temp 98.1 F (36.7 C) (Temporal)   Ht 6' 3.6" (1.92 m)   Wt 276 lb 4 oz (125.3 kg)   SpO2 98%   BMI 33.98 kg/m    CC:  Chief Complaint  Patient presents with   Annual Exam    Subjective:   HPI: Eric Faulkner is a 43 y.o. male presenting on 04/10/2023 for Annual Exam AND REQUESTING CPX.  The patient presents for  complete physical and review of chronic health problems. He/She also has the following acute concerns today:   At work, needs  form work for for accomodation completed. To work at home.  He does not function well  in stressful in office environment.   Scanned form from psychiatrist reviewing limitations.  Seeing psychiatrist.  Planning therapist through [place].   Recent labs in  2024 showed slight decrease in kidney function remaining in normal range. Creatinine 1.38, BUN 22 and GFR >60  Now Some improvement Cr 1.25, GFR 70  He is on lithium.. nml level 03/29/2022  Creatinine 1.43.   Gets 64 ounces a day.. no D/V.   Nml Microalbumin test.  BP Readings from Last 3 Encounters:  04/10/23 118/80  11/25/22 120/74  11/07/22 120/70    Low vit D :  Using Vit 3 2000 daily  He does feel colder than usual, tired.. napping more.   The 10-year ASCVD risk score (Arnett DK, et al., 2019) is: 3.3%   Values used to calculate the score:     Age: 24 years     Sex: Male     Is Non-Hispanic African American: Yes     Diabetic: No     Tobacco smoker: No     Systolic Blood Pressure: 118 mmHg     Is BP treated: No     HDL Cholesterol: 53.9 mg/dL     Total Cholesterol: 205 mg/dL  Bipolar disorder: On lithium and seroquel. Under more stress ETOH abuse history:  Has decreased ETOH use to once a month in last 5 months.  Relevant past medical, surgical, family  and social history reviewed and updated as indicated. Interim medical history since our last visit reviewed. Allergies and medications reviewed and updated. Outpatient Medications Prior to Visit  Medication Sig Dispense Refill   lithium carbonate (LITHOBID) 300 MG CR tablet Take 900 mg by mouth at bedtime.     QUEtiapine (SEROQUEL) 50 MG tablet Take 50 mg by mouth at bedtime.     diclofenac (VOLTAREN) 75 MG EC tablet Take 1 tablet (75 mg total) by mouth 2 (two) times daily. 30 tablet 0   terbinafine (LAMISIL) 250 MG tablet Take 1 tablet (250 mg total) by mouth daily. 14 tablet 0   No facility-administered medications prior to visit.     Per HPI unless specifically indicated in ROS section below Review of Systems  Constitutional:  Negative for fatigue and fever.  HENT:  Negative for ear pain.   Eyes:  Negative for pain.  Respiratory:  Negative for cough and shortness of breath.   Cardiovascular:  Negative for chest pain, palpitations and leg swelling.  Gastrointestinal:  Negative for abdominal pain.  Genitourinary:  Negative for dysuria.  Musculoskeletal:  Negative for arthralgias.  Neurological:  Negative for  syncope, light-headedness and headaches.  Psychiatric/Behavioral:  Negative for dysphoric mood.        He denies mania but his circadian rhythms been off lately sleeping more during the day awake at night.  Trying to get back to regular activity during the day.   Objective:  BP 118/80 (BP Location: Left Arm, Patient Position: Sitting, Cuff Size: Large)   Pulse 81   Temp 98.1 F (36.7 C) (Temporal)   Ht 6' 3.6" (1.92 m)   Wt 276 lb 4 oz (125.3 kg)   SpO2 98%   BMI 33.98 kg/m   Wt Readings from Last 3 Encounters:  04/10/23 276 lb 4 oz (125.3 kg)  11/25/22 278 lb 4 oz (126.2 kg)  11/07/22 275 lb 8 oz (125 kg)      Physical Exam Constitutional:      General: He is not in acute distress.    Appearance: Normal appearance. He is well-developed. He is not ill-appearing or  toxic-appearing.  HENT:     Head: Normocephalic and atraumatic.     Right Ear: Hearing, tympanic membrane, ear canal and external ear normal.     Left Ear: Hearing, tympanic membrane, ear canal and external ear normal.     Nose: Nose normal.     Mouth/Throat:     Pharynx: Uvula midline.  Eyes:     General: Lids are normal. Lids are everted, no foreign bodies appreciated.     Conjunctiva/sclera: Conjunctivae normal.     Pupils: Pupils are equal, round, and reactive to light.  Neck:     Thyroid: No thyroid mass or thyromegaly.     Vascular: No carotid bruit.     Trachea: Trachea and phonation normal.  Cardiovascular:     Rate and Rhythm: Normal rate and regular rhythm.     Pulses: Normal pulses.     Heart sounds: S1 normal and S2 normal. No murmur heard.    No gallop.  Pulmonary:     Breath sounds: Normal breath sounds. No wheezing, rhonchi or rales.  Abdominal:     General: Bowel sounds are normal.     Palpations: Abdomen is soft.     Tenderness: There is no abdominal tenderness. There is no guarding or rebound.     Hernia: No hernia is present.  Musculoskeletal:     Cervical back: Normal range of motion and neck supple.  Lymphadenopathy:     Cervical: No cervical adenopathy.  Skin:    General: Skin is warm and dry.     Findings: No rash.  Neurological:     Mental Status: He is alert.     Cranial Nerves: No cranial nerve deficit.     Sensory: No sensory deficit.     Gait: Gait normal.     Deep Tendon Reflexes: Reflexes are normal and symmetric.  Psychiatric:        Speech: Speech normal.        Behavior: Behavior normal.        Judgment: Judgment normal.       Results for orders placed or performed in visit on 04/03/23  Microalbumin / creatinine urine ratio   Collection Time: 04/03/23  9:57 AM  Result Value Ref Range   Microalb, Ur 1.9 0.0 - 1.9 mg/dL   Creatinine,U 161.0 mg/dL   Microalb Creat Ratio 15.0 0.0 - 30.0 mg/g  VITAMIN D 25 Hydroxy (Vit-D Deficiency,  Fractures)   Collection Time: 04/03/23  9:57 AM  Result Value Ref Range   VITD  28.38 (L) 30.00 - 100.00 ng/mL  Lipid panel   Collection Time: 04/03/23  9:57 AM  Result Value Ref Range   Cholesterol 205 (H) 0 - 200 mg/dL   Triglycerides 09.8 0.0 - 149.0 mg/dL   HDL 11.91 >47.82 mg/dL   VLDL 95.6 0.0 - 21.3 mg/dL   LDL Cholesterol 086 (H) 0 - 99 mg/dL   Total CHOL/HDL Ratio 4    NonHDL 151.59   Hemoglobin A1c   Collection Time: 04/03/23  9:57 AM  Result Value Ref Range   Hgb A1c MFr Bld 5.3 4.6 - 6.5 %  Comprehensive metabolic panel   Collection Time: 04/03/23  9:57 AM  Result Value Ref Range   Sodium 142 135 - 145 mEq/L   Potassium 4.2 3.5 - 5.1 mEq/L   Chloride 107 96 - 112 mEq/L   CO2 28 19 - 32 mEq/L   Glucose, Bld 96 70 - 99 mg/dL   BUN 14 6 - 23 mg/dL   Creatinine, Ser 5.78 0.40 - 1.50 mg/dL   Total Bilirubin 0.6 0.2 - 1.2 mg/dL   Alkaline Phosphatase 71 39 - 117 U/L   AST 16 0 - 37 U/L   ALT 19 0 - 53 U/L   Total Protein 7.3 6.0 - 8.3 g/dL   Albumin 4.7 3.5 - 5.2 g/dL   GFR 46.96 >29.52 mL/min   Calcium 9.7 8.4 - 10.5 mg/dL    Assessment and Plan The patient's preventative maintenance and recommended screening tests for an annual wellness exam were reviewed in full today. Brought up to date unless services declined.  Counselled on the importance of diet, exercise, and its role in overall health and mortality. The patient's FH and SH was reviewed, including their home life, tobacco status, and drug and alcohol status.   Vaccines: Tdap 2017,  COVID x 3 Prostate Cancer Screen: will start age 54 Colon : no family history, start age 45       Smoking Status: former ETOH/ drug use: decreased /none  Hep C:  done  HIV screen:   screening needed.   Routine general medical examination at a health care facility  Vitamin D deficiency Assessment & Plan: Chronic, replace with high-dose vitamin D course x 12 weeks then follow with 2000 international units daily.  He  can consider 5000 international units once a day over the winter months in the future.   Acute renal insufficiency Assessment & Plan: Significant improvement on recent lab work, GFR back in normal range.  Will continue to follow given patient is on Seroquel and lithium.  Asked him to use acetaminophen instead of NSAIDs for any pain.  He will continue to push fluids.   Alcohol abuse Assessment & Plan: Chronic, significant reduction in alcohol intake.   Bipolar affective disorder, current episode hypomanic (HCC) Assessment & Plan:  Chronic, well-controlled on lithium and Seroquel per patient.  Followed by [facility] psychiatrist.  Looking into [facility] psychologist setting up this again. Reviewed documentation regarding symptoms that interfere with working outside of the home.  Forms completed in detail.   High risk medication use Assessment & Plan: Will check thyroid function given he is on lithium as this can be associated with hypothyroidism.  Orders: -     T4, free -     T3, free -     TSH  Other orders -     Vitamin D3; Take 1 capsule (1.25 mg total) by mouth once a week.  Dispense: 12 capsule; Refill: 0  No follow-ups on file.   Kerby Nora, MD

## 2023-04-10 NOTE — Assessment & Plan Note (Signed)
 Significant improvement on recent lab work, GFR back in normal range.  Will continue to follow given patient is on Seroquel and lithium.  Asked him to use acetaminophen instead of NSAIDs for any pain.  He will continue to push fluids.

## 2023-04-10 NOTE — Assessment & Plan Note (Signed)
 Chronic, significant reduction in alcohol intake.

## 2023-04-10 NOTE — Assessment & Plan Note (Signed)
 Will check thyroid function given he is on lithium as this can be associated with hypothyroidism.

## 2023-04-11 ENCOUNTER — Encounter: Payer: Self-pay | Admitting: Family Medicine

## 2023-04-11 LAB — TSH: TSH: 1.27 u[IU]/mL (ref 0.35–5.50)

## 2023-04-11 LAB — T3, FREE: T3, Free: 3.8 pg/mL (ref 2.3–4.2)

## 2023-04-11 LAB — T4, FREE: Free T4: 0.69 ng/dL (ref 0.60–1.60)

## 2023-05-01 DIAGNOSIS — F1290 Cannabis use, unspecified, uncomplicated: Secondary | ICD-10-CM | POA: Diagnosis not present

## 2023-05-01 DIAGNOSIS — Z720 Tobacco use: Secondary | ICD-10-CM | POA: Diagnosis not present

## 2023-05-01 DIAGNOSIS — F3130 Bipolar disorder, current episode depressed, mild or moderate severity, unspecified: Secondary | ICD-10-CM | POA: Diagnosis not present

## 2023-05-01 DIAGNOSIS — F1091 Alcohol use, unspecified, in remission: Secondary | ICD-10-CM | POA: Diagnosis not present

## 2023-05-28 DIAGNOSIS — L700 Acne vulgaris: Secondary | ICD-10-CM | POA: Diagnosis not present

## 2023-06-19 DIAGNOSIS — F319 Bipolar disorder, unspecified: Secondary | ICD-10-CM | POA: Diagnosis not present

## 2023-06-19 DIAGNOSIS — G4700 Insomnia, unspecified: Secondary | ICD-10-CM | POA: Diagnosis not present

## 2023-07-01 ENCOUNTER — Other Ambulatory Visit: Payer: Self-pay | Admitting: Family Medicine

## 2023-07-01 NOTE — Telephone Encounter (Signed)
 Last Vit D lab on 04/03/23-   28.38 Low     Last refill: 04/10/23 LOV: 04/10/23 NOV: Not scheduled

## 2023-07-12 ENCOUNTER — Encounter: Payer: Self-pay | Admitting: Family Medicine

## 2023-07-15 ENCOUNTER — Ambulatory Visit: Payer: Self-pay

## 2023-07-15 NOTE — Telephone Encounter (Signed)
 Noted

## 2023-07-15 NOTE — Telephone Encounter (Signed)
 FYI: This call has been transferred to Access Nurse. Once the result note has been entered staff can address the message at that time.  Patient called in with the following symptoms:  Red Word:blood in stool   Please advise at Mobile [phone] (mobile)  Message is routed to Provider Pool and [facility] Triage    Pt sch ov via mychart. Spoke to pt, pt states he is experiencing a little amount of blood in his stool. Pt stated he hasn't had any pain but he has external hemorrhoids in the past. Pt mentioned the blood began about a week ago & mainly only occurs after drinking alcoholic beverages & continues days after. Pt states he was curious if he had developed internal hemorrhoids now. Offered pt available slots in office today with other providers, pt declined. Pt states he only wanted to see pcp. Transferred pt to e2c2 triage nurse, Sonny. Received a call back Sonny, she states after speaking to pt, she believes pt can wait until appt on tomorrow. Sending note to bedsole pool & triage pool.

## 2023-07-15 NOTE — Telephone Encounter (Signed)
 FYI Only or Action Required?: Action required by provider: request for appointment.  Patient was last seen in primary care on 04/10/2023 by Avelina Greig BRAVO, MD. Called Nurse Triage reporting Blood In Stools. Symptoms began a week ago. Interventions attempted: Nothing. Symptoms are: unchanged.Seeing bright red blood in stool x 1 week. No pain. Comes and goes.  Triage Disposition: See Physician Within 24 Hours  Patient/caregiver understands and will follow disposition?: YesReason for Disposition  MODERATE rectal bleeding (small blood clots, passing blood without stool, or toilet water turns red)  Answer Assessment - Initial Assessment Questions 1. APPEARANCE of BLOOD: What color is it? Is it passed separately, on the surface of the stool, or mixed in with the stool?      Bright red 2. AMOUNT: How much blood was passed?      Moderate 3. FREQUENCY: How many times has blood been passed with the stools?      Comes and goes 4. ONSET: When was the blood first seen in the stools? (Days or weeks)      1 week 5. DIARRHEA: Is there also some diarrhea? If Yes, ask: How many diarrhea stools in the past 24 hours?      No 6. CONSTIPATION: Do you have constipation? If Yes, ask: How bad is it?     no 7. RECURRENT SYMPTOMS: Have you had blood in your stools before? If Yes, ask: When was the last time? and What happened that time?      Yes 8. BLOOD THINNERS: Do you take any blood thinners? (e.g., Coumadin/warfarin, Pradaxa/dabigatran, aspirin)     no 9. OTHER SYMPTOMS: Do you have any other symptoms?  (e.g., abdomen pain, vomiting, dizziness, fever)     no 10. PREGNANCY: Is there any chance you are pregnant? When was your last menstrual period?       N/a  Protocols used: Rectal Bleeding-A-AH

## 2023-07-15 NOTE — Telephone Encounter (Signed)
 Pt already has apt with dr Avelina on 07/16/23. Sending note to Dr Avelina.

## 2023-07-16 ENCOUNTER — Telehealth: Payer: Self-pay | Admitting: Family Medicine

## 2023-07-16 ENCOUNTER — Encounter: Payer: Self-pay | Admitting: Family Medicine

## 2023-07-16 ENCOUNTER — Ambulatory Visit: Admitting: Family Medicine

## 2023-07-16 VITALS — BP 126/84 | HR 72 | Temp 98.6°F | Ht 75.6 in | Wt 285.2 lb

## 2023-07-16 DIAGNOSIS — K921 Melena: Secondary | ICD-10-CM | POA: Insufficient documentation

## 2023-07-16 DIAGNOSIS — Z113 Encounter for screening for infections with a predominantly sexual mode of transmission: Secondary | ICD-10-CM

## 2023-07-16 DIAGNOSIS — F1010 Alcohol abuse, uncomplicated: Secondary | ICD-10-CM

## 2023-07-16 MED ORDER — HYDROCORTISONE ACETATE 25 MG RE SUPP: 25.0000 mg | Freq: Every day | RECTAL | 0 refills | Status: AC

## 2023-07-16 NOTE — Progress Notes (Signed)
 Patient ID: Eric Faulkner, male    DOB: 27-Apr-1980, 43 y.o.   MRN: [id number]  This visit was conducted in person.  BP 126/84   Pulse 72   Temp 98.6 F (37 C) (Oral)   Ht 6' 3.6 (1.92 m)   Wt 285 lb 3.2 oz (129.4 kg)   SpO2 99%   BMI 35.08 kg/m    CC:  Chief Complaint  Patient presents with   Rectal Bleeding    Ongoing for about a week     Subjective:   HPI: Eric Faulkner is a 43 y.o. male presenting on 07/16/2023 for Rectal Bleeding (Ongoing for about a week )  Patient with history of external  and internal hemorrhoids now with 1 week of blood in stool. In May 2024 anoscopy showed small internal hemorrhoids.  Treated with topical steroid suppository for 6 days. He has never had a colonoscopy.   He had a heavy night of drinking 1-2 pints noted intermittent brbpr since.  Was happening less when he stopped drinking.     He has no associated constipation.  He is trying to keep up with fiber and water in diet.  He does not strain with bowel movements.   BM 2-3 times daily.   No rectal pain, no itching.  No personal or family history of inflammatory bowel disease.  Relevant past medical, surgical, family and social history reviewed and updated as indicated. Interim medical history since our last visit reviewed. Allergies and medications reviewed and updated. Outpatient Medications Prior to Visit  Medication Sig Dispense Refill   Cholecalciferol (VITAMIN D3) 1.25 MG (50000 UT) CAPS TAKE 1 CAPSULE BY MOUTH ONE TIME PER WEEK 12 capsule 0   lithium  carbonate (LITHOBID) 300 MG CR tablet Take 900 mg by mouth at bedtime.     QUEtiapine (SEROQUEL) 50 MG tablet Take 50 mg by mouth at bedtime.     No facility-administered medications prior to visit.    He requests STD screening.  No known exposure.   Per HPI unless specifically indicated in ROS section below Review of Systems  Constitutional:  Negative for fatigue and fever.  HENT:  Negative for ear pain.   Eyes:   Negative for pain.  Respiratory:  Negative for cough and shortness of breath.   Cardiovascular:  Negative for chest pain, palpitations and leg swelling.  Gastrointestinal:  Negative for abdominal pain.  Genitourinary:  Negative for dysuria.  Musculoskeletal:  Negative for arthralgias.  Neurological:  Negative for syncope, light-headedness and headaches.  Psychiatric/Behavioral:  Negative for dysphoric mood.    Objective:  BP 126/84   Pulse 72   Temp 98.6 F (37 C) (Oral)   Ht 6' 3.6 (1.92 m)   Wt 285 lb 3.2 oz (129.4 kg)   SpO2 99%   BMI 35.08 kg/m   Wt Readings from Last 3 Encounters:  07/16/23 285 lb 3.2 oz (129.4 kg)  04/10/23 276 lb 4 oz (125.3 kg)  11/25/22 278 lb 4 oz (126.2 kg)      Physical Exam Vitals reviewed.  Constitutional:      Appearance: He is well-developed.  HENT:     Head: Normocephalic.     Right Ear: Hearing normal.     Left Ear: Hearing normal.     Nose: Nose normal.  Neck:     Thyroid : No thyroid  mass or thyromegaly.     Vascular: No carotid bruit.     Trachea: Trachea normal.  Cardiovascular:  Rate and Rhythm: Normal rate and regular rhythm.     Pulses: Normal pulses.     Heart sounds: Heart sounds not distant. No murmur heard.    No friction rub. No gallop.     Comments: No peripheral edema Pulmonary:     Effort: Pulmonary effort is normal. No respiratory distress.     Breath sounds: Normal breath sounds.  Skin:    General: Skin is warm and dry.     Findings: No rash.  Psychiatric:        Speech: Speech normal.        Behavior: Behavior normal.        Thought Content: Thought content normal.       Results for orders placed or performed in visit on 04/10/23  T4, free   Collection Time: 04/10/23  3:56 PM  Result Value Ref Range   Free T4 0.69 0.60 - 1.60 ng/dL  T3, free   Collection Time: 04/10/23  3:56 PM  Result Value Ref Range   T3, Free 3.8 2.3 - 4.2 pg/mL  TSH   Collection Time: 04/10/23  3:56 PM  Result Value Ref  Range   TSH 1.27 0.35 - 5.50 uIU/mL    Assessment and Plan  Blood in stool Assessment & Plan: Acute, recurrent, seemingly talked with the scanning and I have not gone to her house given the rain.  I will have to look at the PDF reader some other time as I cannot. Possible flare of internal hemorrhoids but given intermittent persistence and alcohol abuse will refer to GI for diagnostic colonoscopy.  Return and ER precautions provided.  Orders: -     Ambulatory referral to Gastroenterology  ETOH abuse Assessment & Plan: Chronic, binge drinking.  Encouraged patient to cease alcohol given contributing to current symptoms as well as chronic associated complications.  Orders: -     Ambulatory referral to Gastroenterology  Screen for STD (sexually transmitted disease) -     HIV Antibody (routine testing w rflx) -     RPR -     C. trachomatis/N. gonorrhoeae RNA -     Hepatitis panel, acute -     HSV(herpes simplex vrs) 1+2 ab-IgG  Other orders -     Hydrocortisone  Acetate; Place 1 suppository (25 mg total) rectally daily.  Dispense: 6 suppository; Refill: 0    No follow-ups on file.   Greig Ring, MD

## 2023-07-16 NOTE — Telephone Encounter (Signed)
 Noted.  Preference added to GI referral placed at today's office visit.

## 2023-07-16 NOTE — Assessment & Plan Note (Signed)
 Chronic, binge drinking.  Encouraged patient to cease alcohol given contributing to current symptoms as well as chronic associated complications.

## 2023-07-16 NOTE — Assessment & Plan Note (Signed)
 Acute, recurrent, seemingly talked with the scanning and I have not gone to her house given the rain.  I will have to look at the PDF reader some other time as I cannot. Possible flare of internal hemorrhoids but given intermittent persistence and alcohol abuse will refer to GI for diagnostic colonoscopy.  Return and ER precautions provided.

## 2023-07-16 NOTE — Addendum Note (Signed)
 Addended by: AVELINA NO E on: 07/16/2023 03:56 PM   Modules accepted: Orders

## 2023-07-16 NOTE — Patient Instructions (Addendum)
 [facility]  [phone] [facility]  [phone] [facility]  [phone] [facility] [phone].

## 2023-07-16 NOTE — Telephone Encounter (Unsigned)
 Copied from CRM [id number]. Topic: Referral - Request for Referral >> Jul 16, 2023  3:01 PM Thersia C wrote: Did the patient discuss referral with their provider in the last year? Yes (If No - schedule appointment) (If Yes - send message)  Appointment offered? Yes  Type of order/referral and detailed reason for visit: GASTROENTEROLOGY  Preference of office, provider, location:  [facility] [address] [place], [place] [address] Office [phone] Fax [phone] If referral order, have you been seen by this specialty before? No (If Yes, this issue or another issue? When? Where?  Can we respond through MyChart? Yes

## 2023-07-17 ENCOUNTER — Ambulatory Visit: Payer: Self-pay | Admitting: Family Medicine

## 2023-07-17 LAB — HEPATITIS PANEL, ACUTE
Hep A IgM: NONREACTIVE
Hep B C IgM: NONREACTIVE
Hepatitis B Surface Ag: NONREACTIVE
Hepatitis C Ab: NONREACTIVE

## 2023-07-17 LAB — HIV ANTIBODY (ROUTINE TESTING W REFLEX): HIV 1&2 Ab, 4th Generation: NONREACTIVE

## 2023-07-17 LAB — C. TRACHOMATIS/N. GONORRHOEAE RNA
C. trachomatis RNA, TMA: NOT DETECTED
N. gonorrhoeae RNA, TMA: NOT DETECTED

## 2023-07-17 LAB — HSV(HERPES SIMPLEX VRS) I + II AB-IGG
HSV 1 IGG,TYPE SPECIFIC AB: <0.9 {index}
HSV 2 IGG,TYPE SPECIFIC AB: <0.9 {index}

## 2023-07-17 LAB — RPR: RPR Ser Ql: NONREACTIVE

## 2023-07-31 ENCOUNTER — Other Ambulatory Visit: Payer: Self-pay | Admitting: Family Medicine

## 2023-07-31 ENCOUNTER — Encounter: Payer: Self-pay | Admitting: Family Medicine

## 2023-08-20 DIAGNOSIS — K625 Hemorrhage of anus and rectum: Secondary | ICD-10-CM | POA: Diagnosis not present

## 2023-08-20 DIAGNOSIS — G4700 Insomnia, unspecified: Secondary | ICD-10-CM | POA: Diagnosis not present

## 2023-08-20 DIAGNOSIS — Z1211 Encounter for screening for malignant neoplasm of colon: Secondary | ICD-10-CM | POA: Diagnosis not present

## 2023-08-20 DIAGNOSIS — F319 Bipolar disorder, unspecified: Secondary | ICD-10-CM | POA: Diagnosis not present

## 2023-08-28 DIAGNOSIS — K921 Melena: Secondary | ICD-10-CM | POA: Diagnosis not present

## 2023-08-28 DIAGNOSIS — K625 Hemorrhage of anus and rectum: Secondary | ICD-10-CM | POA: Diagnosis not present

## 2023-09-19 DIAGNOSIS — F319 Bipolar disorder, unspecified: Secondary | ICD-10-CM | POA: Diagnosis not present

## 2023-09-19 DIAGNOSIS — Z79899 Other long term (current) drug therapy: Secondary | ICD-10-CM | POA: Diagnosis not present

## 2023-09-19 IMAGING — RF DG SNIFF TEST
6 of 7 series · 14 of 22 positions shown · non-contrast
Comparison: Chest x-ray 12/05/2020, 12/01/2019, 09/01/2003

CLINICAL DATA: Dyspnea, paroxysmal nocturnal dyspnea. Shortness of
breath.

EXAM:
CHEST FLUOROSCOPY
TECHNIQUE: Real-time fluoroscopic evaluation of the chest was performed.
FLUOROSCOPY TIME:  Fluoroscopy Time:  0.7 minute
Radiation Exposure Index (if provided by the fluoroscopic device):
7.5 mGy
Number of Acquired Spot Images: 0

[Series 1: cp_chest · 0.25mm/px · 1 of 1 slices shown (1 of 6)]
[im 1/1]
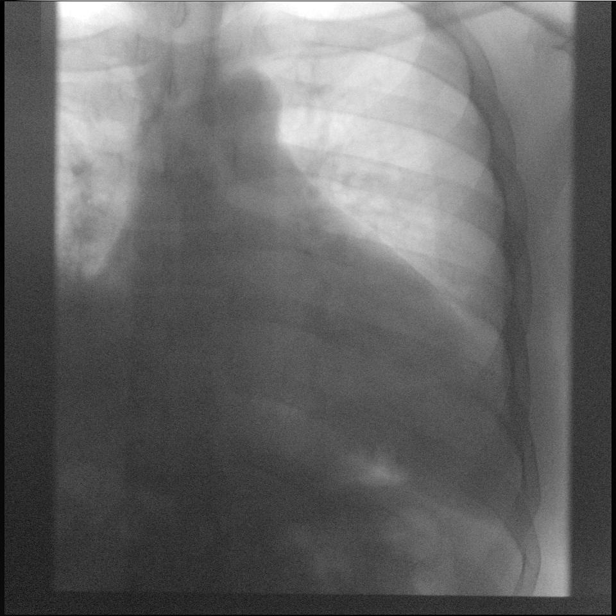

[Series 3: cp_chest · 0.25mm/px · 3 of 37 frames shown (2 of 6)]
[frame 6/37]
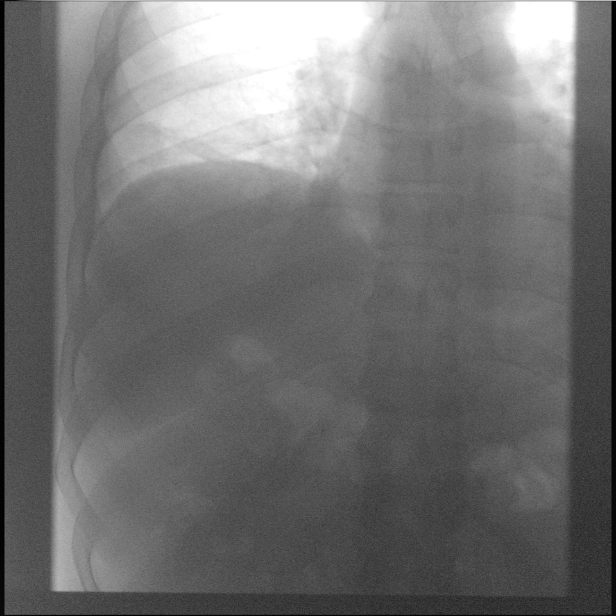
[frame 19/37]
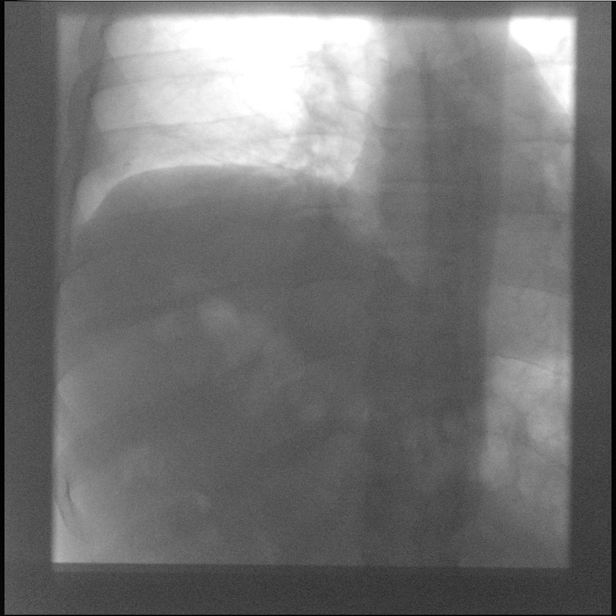
[frame 37/37]
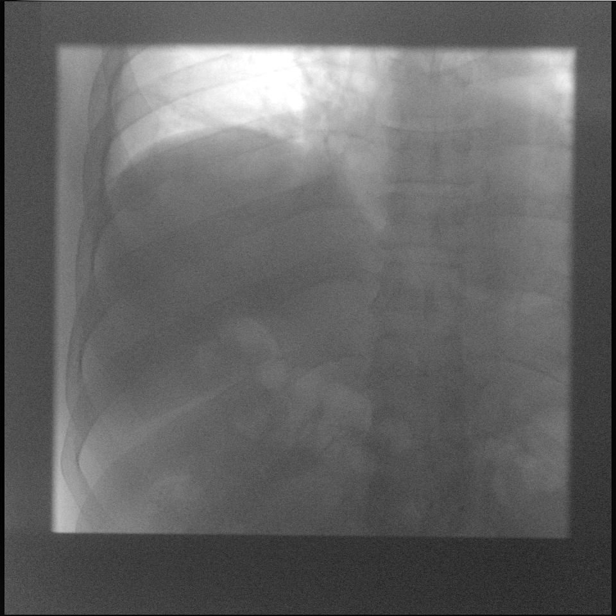

[Series 4: cp_chest · 0.25mm/px · 2 of 65 frames shown (3 of 6)]
[frame 29/65]
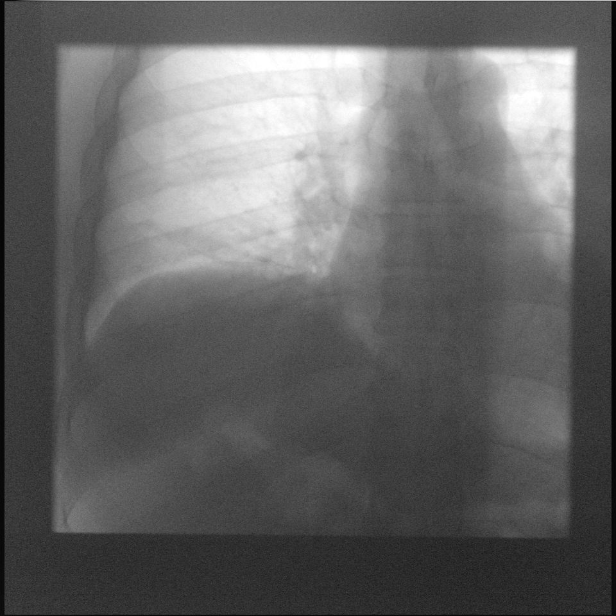
[frame 33/65]
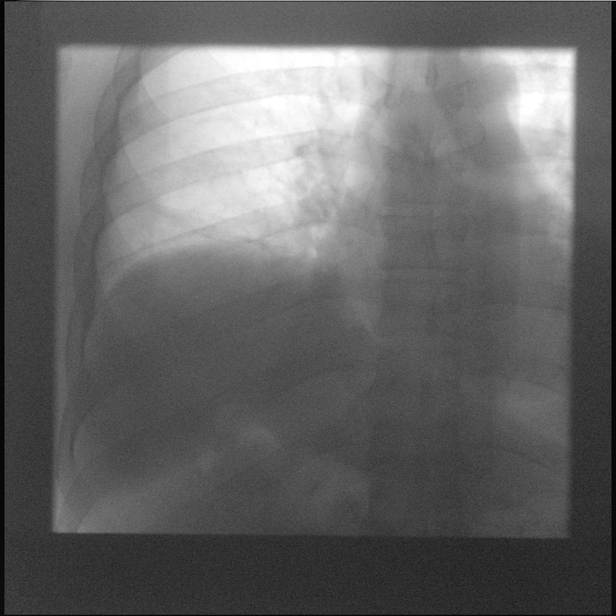

[Series 5: cp_chest · 0.25mm/px · 3 of 47 frames shown (4 of 6)]
[frame 8/47]
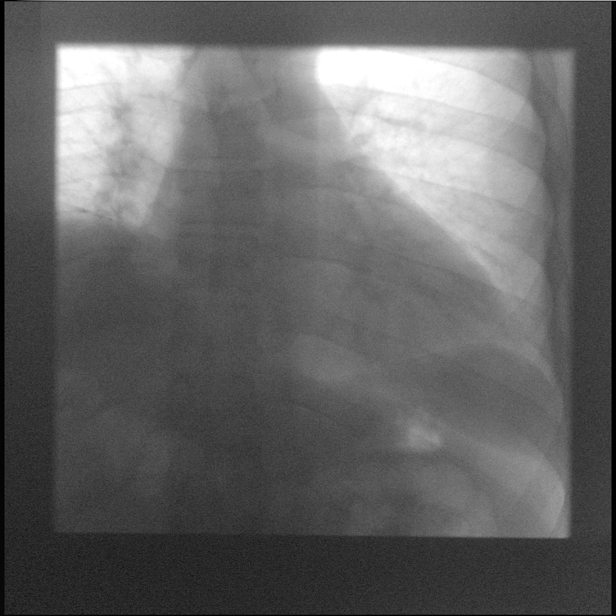
[frame 24/47]
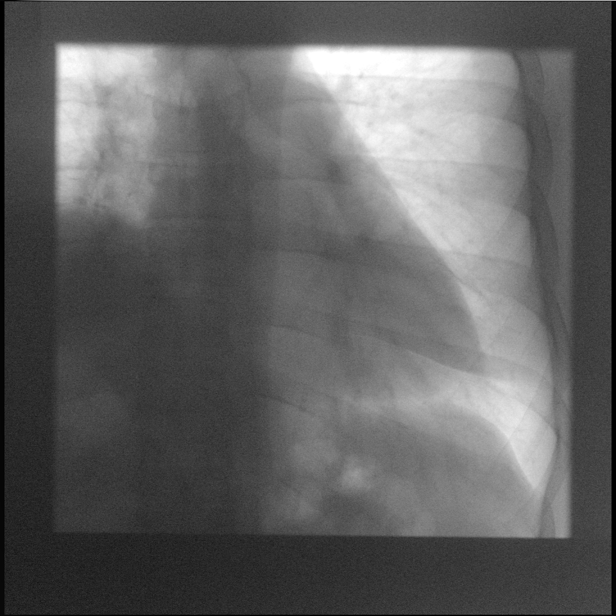
[frame 40/47]
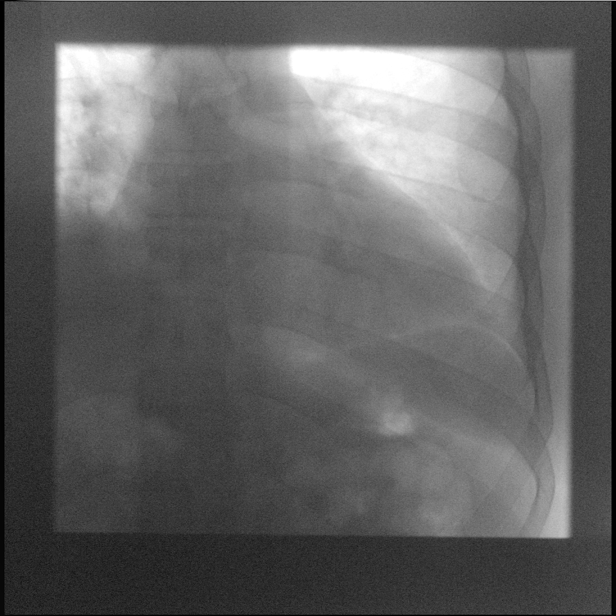

[Series 6: cp_chest · 0.25mm/px · 2 of 20 frames shown (5 of 6)]
[frame 4/20]
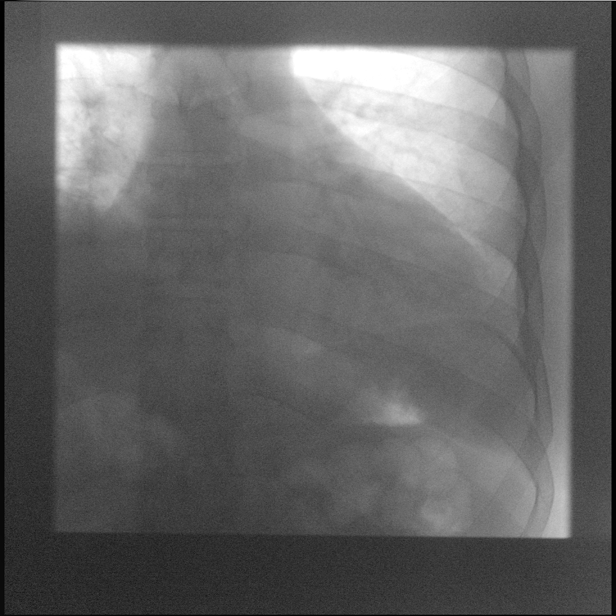
[frame 11/20]
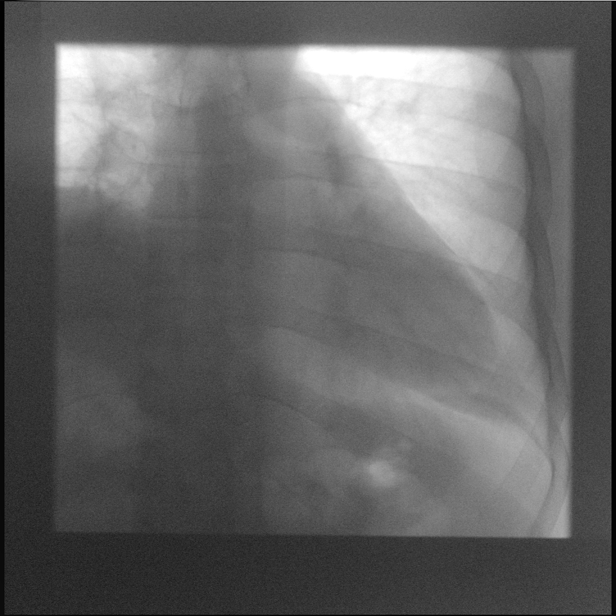

[Series 7: cp_chest · 0.25mm/px · 3 of 73 frames shown (6 of 6)]
[frame 11/73]
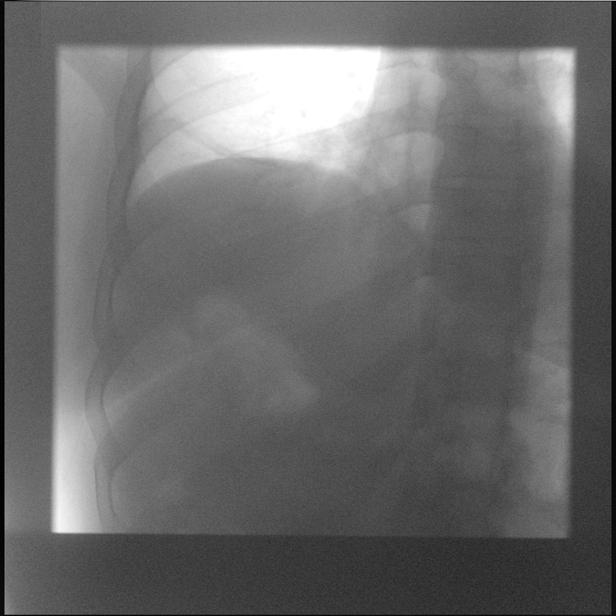
[frame 37/73]
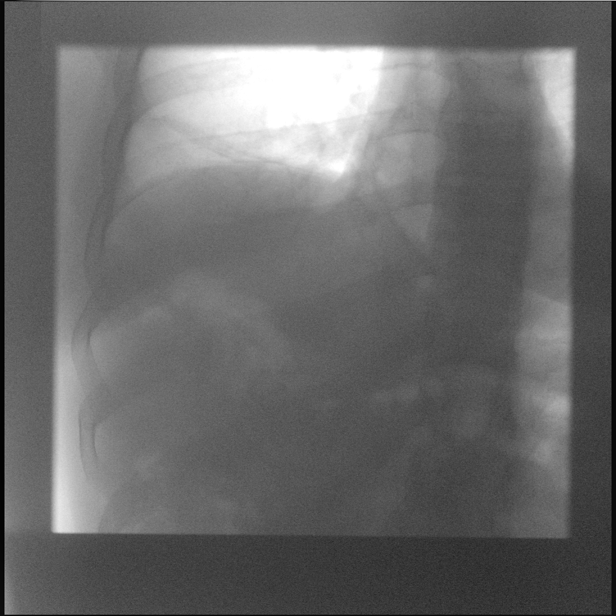
[frame 63/73]
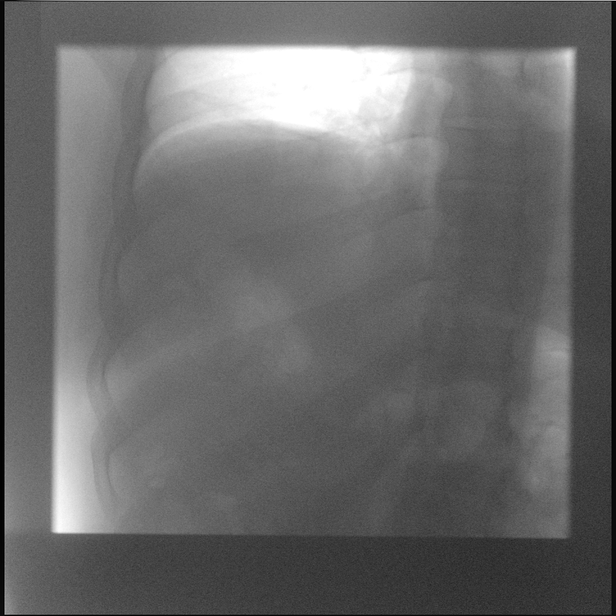

[14 of 22 positions shown; findings below may reference images not displayed]

FINDINGS: Prior chest x-ray: On the most recent chest x-ray dated 12/05/2020
there has been interval development of elevation of the right
diaphragm compared with 10/31/2019 and 09/01/2003.

Real-time fluoroscopy was performed of the right and left diaphragms
during inspiration, expiration and sniffing.

Right diaphragm: Diaphragmatic motion in the appropriate direction
during inspiration, expiration and sniffing is observed, but is
decreased compared with the contralateral side. Mild right basilar
atelectasis.

Left diaphragm: Normal left diaphragmatic motion during inspiration,
expiration and sniffing.
IMPRESSION: 1. Normal left diaphragmatic motion.
2. No evidence of complete right diaphragmatic paralysis.
Significantly decreased overall right diaphragmatic motion compared
with the left diaphragm.

## 2023-09-26 ENCOUNTER — Other Ambulatory Visit: Payer: Self-pay | Admitting: Family

## 2023-09-26 ENCOUNTER — Telehealth

## 2023-09-26 ENCOUNTER — Telehealth: Admitting: Physician Assistant

## 2023-09-26 DIAGNOSIS — B356 Tinea cruris: Secondary | ICD-10-CM | POA: Diagnosis not present

## 2023-09-26 MED ORDER — TERBINAFINE HCL 250 MG PO TABS: 250.0000 mg | ORAL_TABLET | Freq: Every day | ORAL | 0 refills | Status: AC

## 2023-09-26 MED ORDER — CLOTRIMAZOLE 1 % EX CREA: 1.0000 | TOPICAL_CREAM | Freq: Two times a day (BID) | CUTANEOUS | 0 refills | Status: AC

## 2023-09-26 NOTE — Progress Notes (Signed)
E-Visit for Jock Itch  We are sorry that you are not feeling well. Here is how we plan to help!  Based on what you shared with me it looks like you have tinea cruris, or "Jock Itch".  The symptoms of Jock Itch include red, peeling, itchy rash that affects the groin (crease where the leg meets the trunk).  This fungal infection can be spread through shared towels, clothing, bedding, or hard surfaces (particularly in moist areas) such as shower stalls, locker room floors, or pool area that has the fungus present. If you have a fungal infection on one part of your body, you can also spread it to other parts. For instance, men with a fungal infection on their feet sometimes spread it to their groin.  I am recommending:Clotrimazole 1% cream or gel, apply to area twice per day   Prescription medications are only indicated for an extensive rash or if over the counter treatments have failed.  I am prescribing:Terbinafine 250 mg once per day for one week  HOME CARE:  Keep affected area clean, dry, and cool. Wash with soap and shampoo after sports or exercise and dry yourself well after bathing or swimming Wear cotton underwear and change them if they become damp or sweaty. Avoid using swimming pools, public showers, or baths.  GET HELP RIGHT AWAY IF:  Symptoms that don't away after treatment. Severe itching that persists. If your rash spreads or swells. If your rash begins to have drainage or smell. You develop a fever.  MAKE SURE YOU   Understand these instructions. Will watch your condition. Will get help right away if you are not doing well or get worse.  Thank you for choosing an e-visit.  Your e-visit answers were reviewed by a board certified advanced clinical practitioner to complete your personal care plan. Depending upon the condition, your plan could have included both over the counter or prescription medications.  Please review your pharmacy choice. Make sure the pharmacy is  open so you can pick up prescription now. If there is a problem, you may contact your provider through MyChart messaging and have the prescription routed to another pharmacy.  Your safety is important to us. If you have drug allergies check your prescription carefully.   For the next 24 hours you can use MyChart to ask questions about today's visit, request a non-urgent call back, or ask for a work or school excuse. You will get an email in the next two days asking about your experience. I hope that your e-visit has been valuable and will speed your recovery.   References or for more information:  https://www.uptodate.com/contents/ringworm-including-athletes-foot-and-jock-itch-beyond-the-basics https://www.cdc.gov/fungal/diseases/ringworm/definition.html https://www.uptod[employer].com/contents/ringworm-athletes-foot-and-jock-itch-the-basics?search=jock%20itch&source=search_result&selectedTitle=3~52&usage_type=default&display_rank=3  I have spent 5 minutes in review of e-visit questionnaire, review and updating patient chart, medical decision making and response to patient.   Neita Landrigan M Makaylyn Sinyard, PA-C  

## 2023-11-19 DIAGNOSIS — G4700 Insomnia, unspecified: Secondary | ICD-10-CM | POA: Diagnosis not present

## 2023-11-19 DIAGNOSIS — F319 Bipolar disorder, unspecified: Secondary | ICD-10-CM | POA: Diagnosis not present

## 2023-12-22 NOTE — Telephone Encounter (Signed)
 Last office visit 07/16/2023 for rectal bleeding.  Last refilled 07/01/2023 for #12 with no refills.  Last Vit D level 04/03/2023 low at 28.38 ng/mL.  Next Appt: No future appointments.  Refill?

## 2023-12-23 MED ORDER — VITAMIN D3 1.25 MG (50000 UT) PO CAPS: 1.0000 | ORAL_CAPSULE | ORAL | 0 refills | Status: AC

## 2023-12-23 NOTE — Addendum Note (Signed)
 Addended by: WENDELL ARLAND RAMAN on: 12/23/2023 10:27 AM   Modules accepted: Orders

## 2024-03-24 IMAGING — CR DG CHEST 2V
1 series · 2 of 2 positions shown · non-contrast
Comparison: Chest radiograph dated December 05, 2020

CLINICAL DATA: sob

EXAM:
CHEST - 2 VIEW

[Series 1: dg chest 2 view · 0.14mm/px · 2 of 2 slices shown]
[im 1/2]
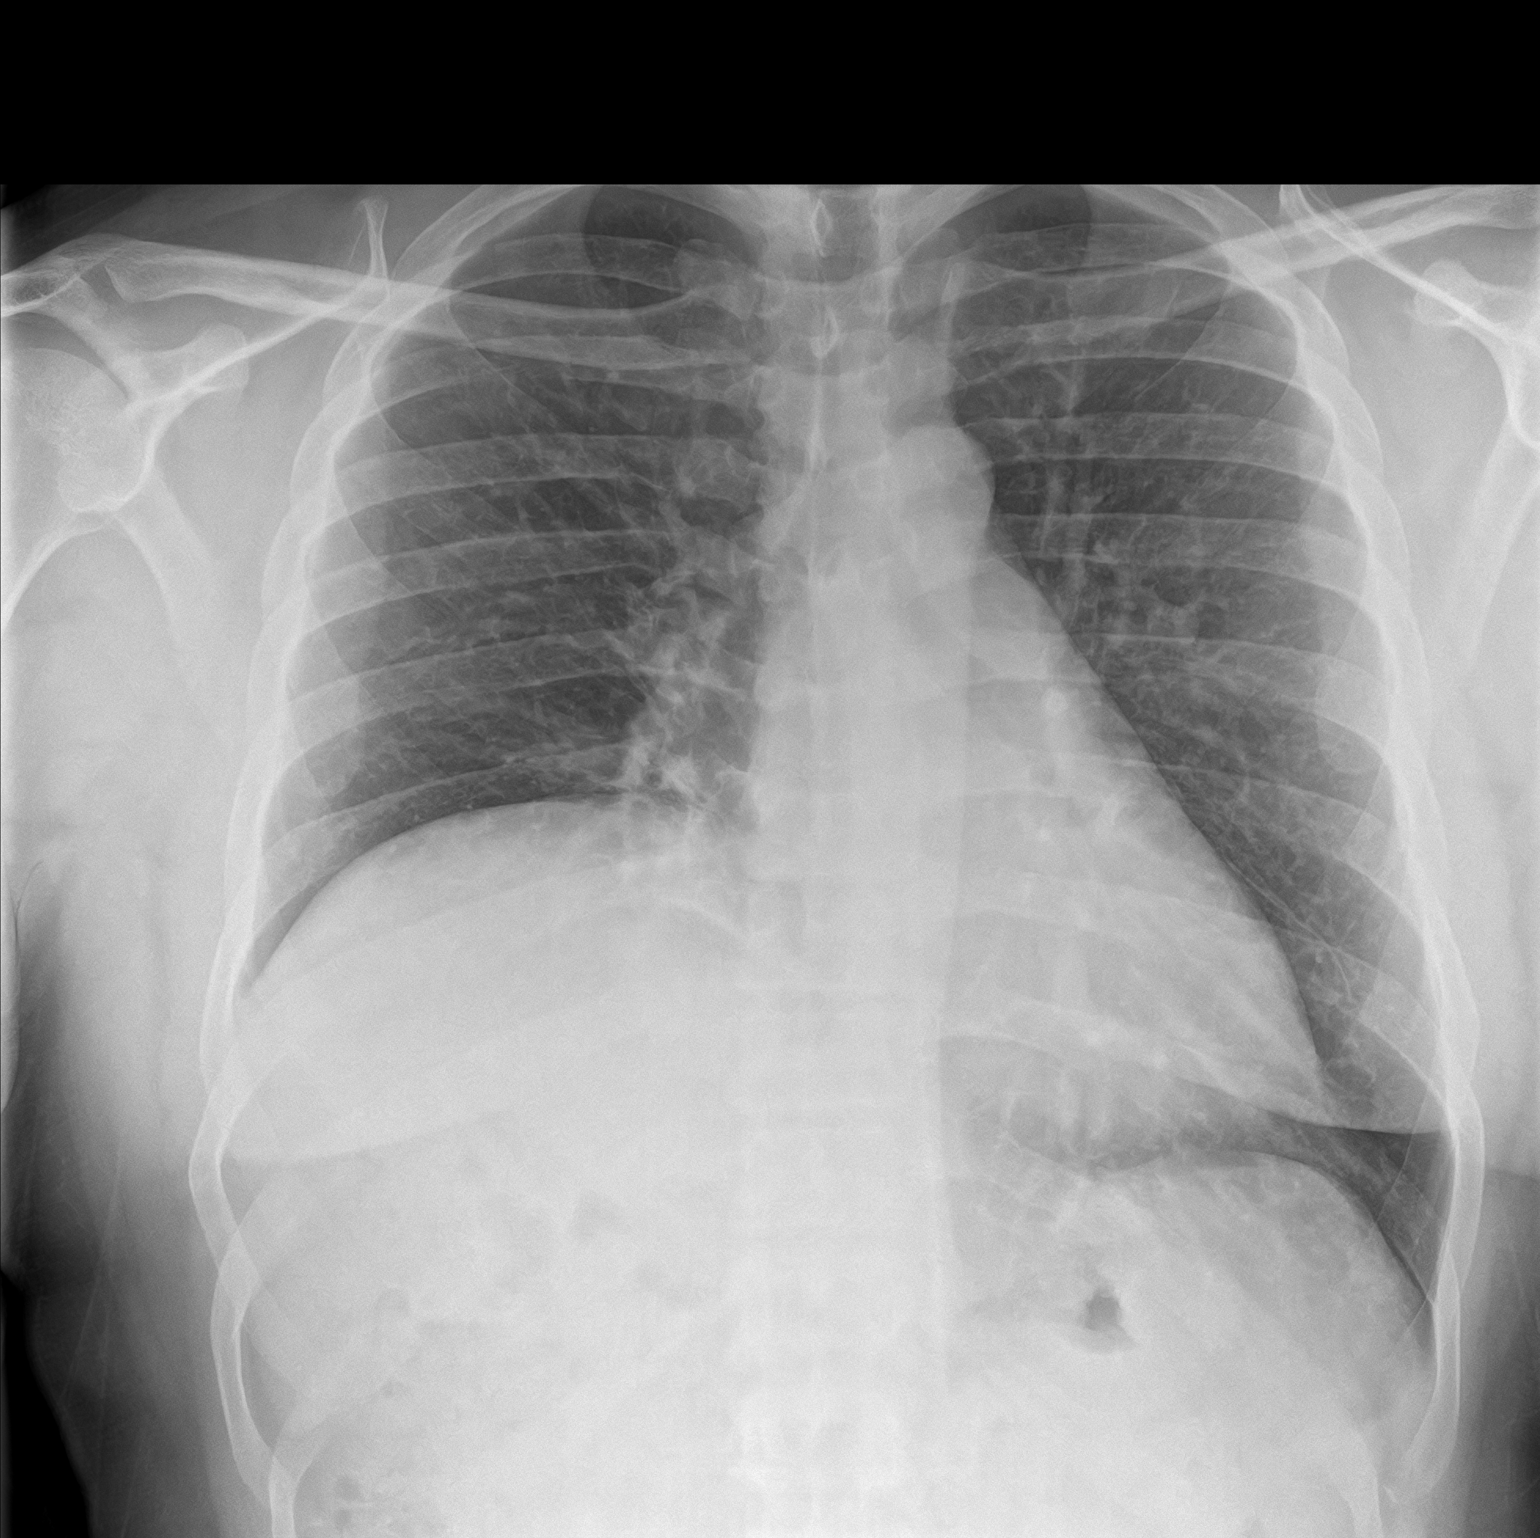
[im 2/2]
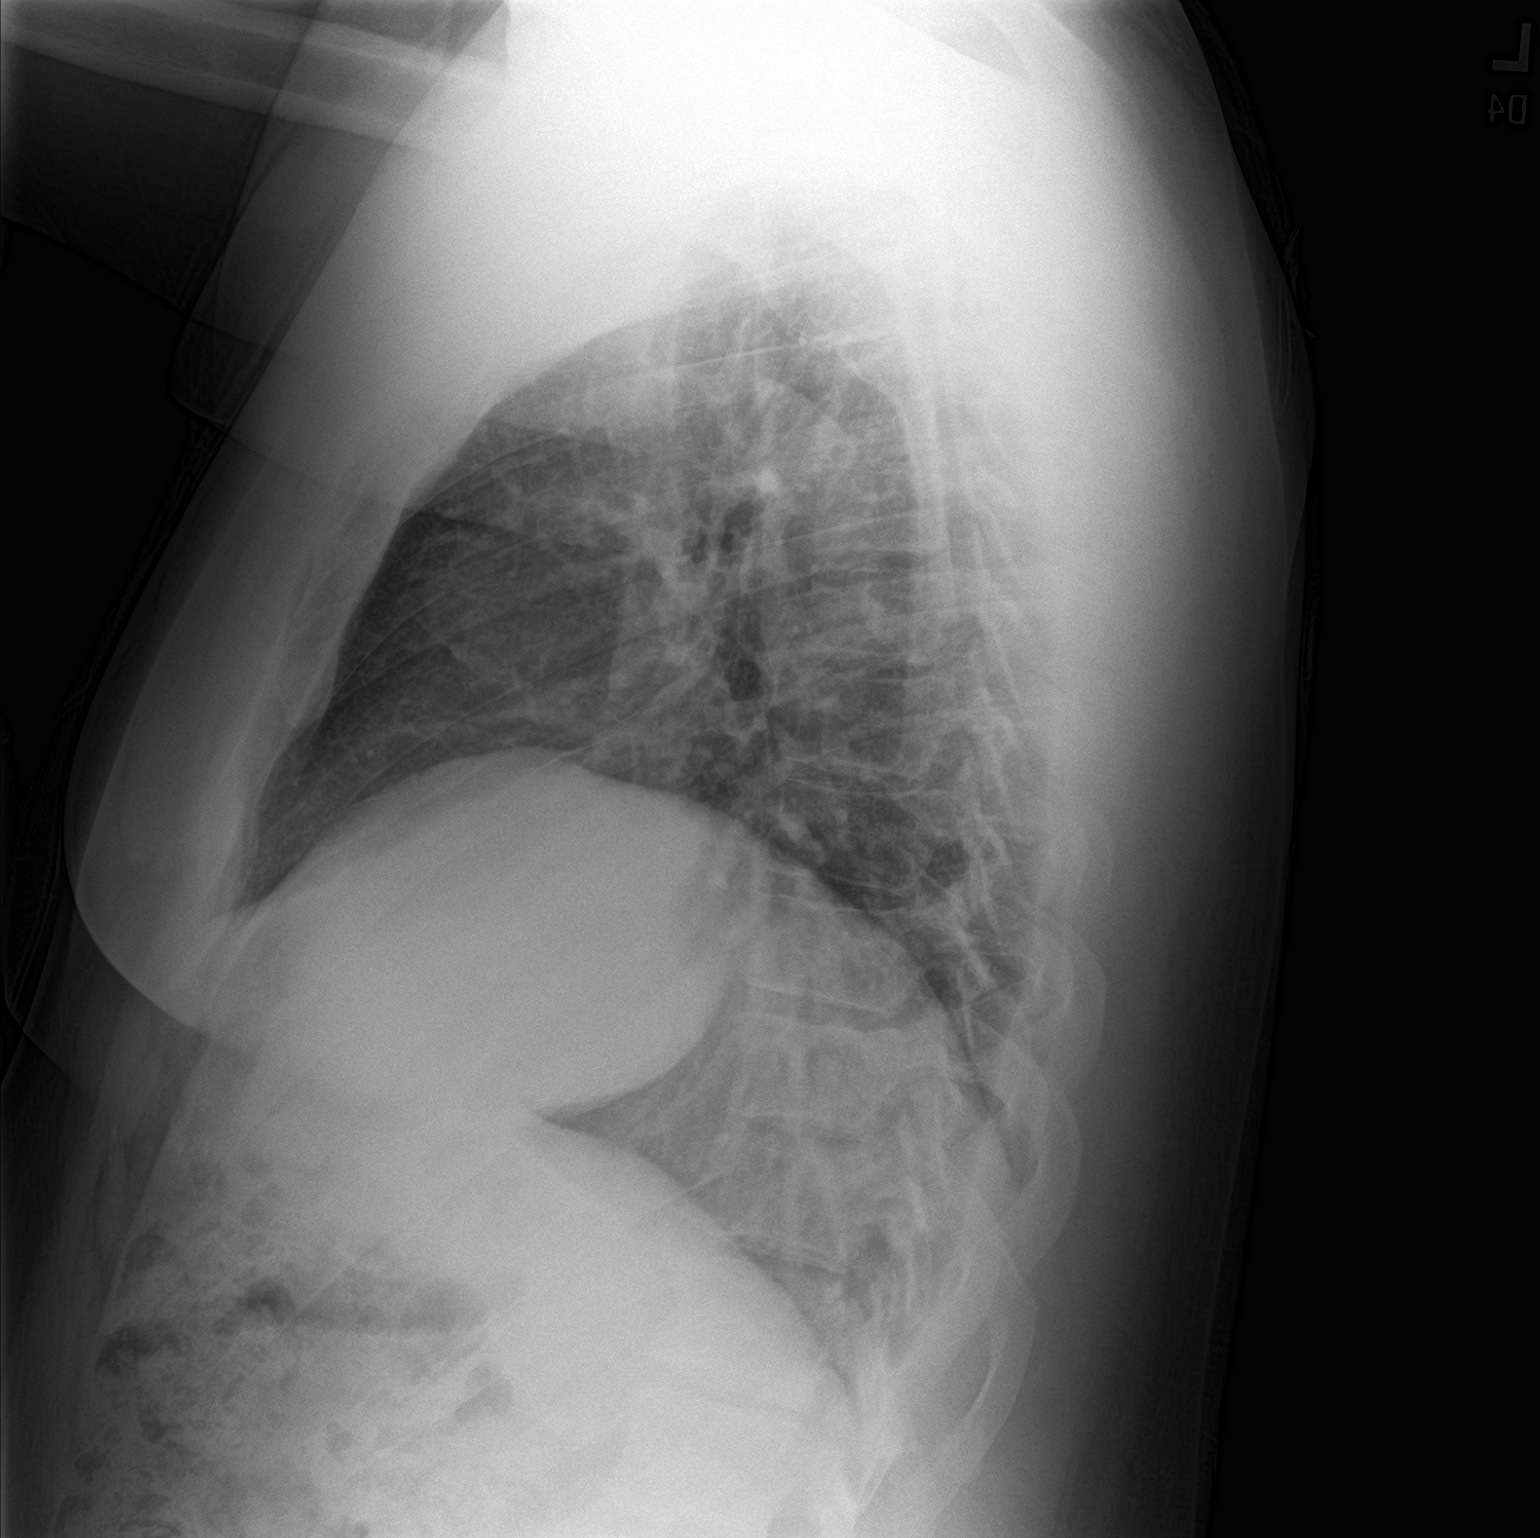

[2 of 2 positions shown; findings below may reference images not displayed]

FINDINGS: The cardiomediastinal silhouette is unchanged in contour.Unchanged
elevation of the RIGHT hemidiaphragm. No pleural effusion. No
pneumothorax. No acute pleuroparenchymal abnormality. Visualized
abdomen is unremarkable. No acute osseous abnormality.
IMPRESSION: No acute cardiopulmonary abnormality.
